# Patient Record
Sex: Female | Born: 1995 | Race: Black or African American | Hispanic: No | Marital: Single | Smoking: Former smoker
Health system: Southern US, Community
[De-identification: ages and names within clinical notes are randomized; demographics above are authoritative.]

## PROBLEM LIST (undated history)

## (undated) DIAGNOSIS — I10 Essential (primary) hypertension: Secondary | ICD-10-CM

## (undated) DIAGNOSIS — J189 Pneumonia, unspecified organism: Secondary | ICD-10-CM

## (undated) DIAGNOSIS — Z973 Presence of spectacles and contact lenses: Secondary | ICD-10-CM

## (undated) DIAGNOSIS — F32A Depression, unspecified: Secondary | ICD-10-CM

## (undated) DIAGNOSIS — J45909 Unspecified asthma, uncomplicated: Secondary | ICD-10-CM

## (undated) DIAGNOSIS — F419 Anxiety disorder, unspecified: Secondary | ICD-10-CM

## (undated) DIAGNOSIS — S82892A Other fracture of left lower leg, initial encounter for closed fracture: Secondary | ICD-10-CM

## (undated) DIAGNOSIS — F329 Major depressive disorder, single episode, unspecified: Secondary | ICD-10-CM

---
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
## 2018-12-17 ENCOUNTER — Other Ambulatory Visit: Payer: Self-pay

## 2018-12-17 ENCOUNTER — Encounter (HOSPITAL_COMMUNITY): Payer: Self-pay

## 2018-12-17 ENCOUNTER — Emergency Department (HOSPITAL_COMMUNITY)
Admission: EM | Admit: 2018-12-17 | Discharge: 2018-12-17 | Disposition: A | Discharge status: Home / Self Care | Payer: Self-pay | Attending: Emergency Medicine | Admitting: Emergency Medicine

## 2018-12-17 DIAGNOSIS — Y92410 Unspecified street and highway as the place of occurrence of the external cause: Secondary | ICD-10-CM | POA: Insufficient documentation

## 2018-12-17 DIAGNOSIS — J45909 Unspecified asthma, uncomplicated: Secondary | ICD-10-CM | POA: Insufficient documentation

## 2018-12-17 DIAGNOSIS — S0990XA Unspecified injury of head, initial encounter: Secondary | ICD-10-CM | POA: Insufficient documentation

## 2018-12-17 DIAGNOSIS — Y9389 Activity, other specified: Secondary | ICD-10-CM | POA: Insufficient documentation

## 2018-12-17 DIAGNOSIS — Y999 Unspecified external cause status: Secondary | ICD-10-CM | POA: Insufficient documentation

## 2018-12-17 HISTORY — DX: Unspecified asthma, uncomplicated: J45.909
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
## 2019-04-03 ENCOUNTER — Telehealth (HOSPITAL_COMMUNITY): Payer: Self-pay | Admitting: Emergency Medicine

## 2019-04-03 ENCOUNTER — Ambulatory Visit (HOSPITAL_COMMUNITY)
Admission: EM | Admit: 2019-04-03 | Discharge: 2019-04-03 | Disposition: A | Discharge status: Home / Self Care | Payer: BC Managed Care – PPO | Attending: Family Medicine | Admitting: Family Medicine

## 2019-04-03 ENCOUNTER — Other Ambulatory Visit: Payer: Self-pay

## 2019-04-03 ENCOUNTER — Encounter (HOSPITAL_COMMUNITY): Payer: Self-pay

## 2019-04-03 DIAGNOSIS — Z113 Encounter for screening for infections with a predominantly sexual mode of transmission: Secondary | ICD-10-CM | POA: Diagnosis not present

## 2019-04-03 DIAGNOSIS — N760 Acute vaginitis: Secondary | ICD-10-CM | POA: Diagnosis not present

## 2019-04-03 DIAGNOSIS — B373 Candidiasis of vulva and vagina: Secondary | ICD-10-CM

## 2019-04-03 DIAGNOSIS — Z3202 Encounter for pregnancy test, result negative: Secondary | ICD-10-CM | POA: Diagnosis not present

## 2019-04-03 DIAGNOSIS — B3731 Acute candidiasis of vulva and vagina: Secondary | ICD-10-CM

## 2019-04-03 LAB — POCT URINALYSIS DIP (DEVICE)
Bilirubin Urine: NEGATIVE
Glucose, UA: NEGATIVE mg/dL
Ketones, ur: NEGATIVE mg/dL
Leukocytes,Ua: NEGATIVE
Nitrite: NEGATIVE
Protein, ur: 30 mg/dL — AB
Specific Gravity, Urine: 1.025 (ref 1.005–1.030)
Urobilinogen, UA: 1 mg/dL (ref 0.0–1.0)
pH: 6 (ref 5.0–8.0)

## 2019-04-03 LAB — POCT PREGNANCY, URINE: Preg Test, Ur: NEGATIVE

## 2019-04-03 MED ORDER — FLUCONAZOLE 200 MG PO TABS: 200.0000 mg | ORAL_TABLET | Freq: Once | ORAL | 0 refills | Status: AC

## 2019-04-03 MED ORDER — FLUCONAZOLE 200 MG PO TABS: 200.0000 mg | ORAL_TABLET | Freq: Once | ORAL | 0 refills | Status: DC

## 2019-04-03 NOTE — ED Provider Notes (Signed)
[place]    CSN: [id number] Arrival date & time: 04/03/19  1738     History   Chief Complaint Chief Complaint  Patient presents with  . Vaginitis    HPI Lisa Espinoza is a 23 y.o. female presenting for acute concern of yeast infection.  Patient states that she noticed symptoms the other week, was trying to treat with over-the-counter Monistat and some "cooling wipes ".  Patient states that helped a little bit, though still having vaginal irritation, thick, white discharge and pruritus.  Patient states that after using the wipes 2 days ago she developed significant swelling, pain, itching, and scant blood.  Patient did not use wipes again, has been trying warm soaks, though is still having significant pain.  Patient denies abdominal, pelvic pain, frequency, urgency.  Patient does endorse a burning sensation when she urinates, "if it hits that area ".  Patient also requesting STD check: Currently sexually active 1 female partner, not routinely using condoms.   Past Medical History:  Diagnosis Date  . Asthma     There are no active problems to display for this patient.   History reviewed. No pertinent surgical history.  OB History   No obstetric history on file.      Home Medications    Prior to Admission medications   Not on File    Family History Family History  Family history unknown: Yes    Social History Social History   Tobacco Use  . Smoking status: Never Smoker  . Smokeless tobacco: Never Used  Substance Use Topics  . Alcohol use: Never    Frequency: Never  . Drug use: Never     Allergies   Patient has no known allergies.   Review of Systems As per HPI   Physical Exam Triage Vital Signs ED Triage Vitals  Enc Vitals Group     BP 04/03/19 1820 (!) 140/91     Pulse Rate 04/03/19 1820 87     Resp 04/03/19 1820 18     Temp 04/03/19 1820 99.5 F (37.5 C)     Temp Source 04/03/19 1820 Oral     SpO2 04/03/19 1820 98 %   Weight 04/03/19 1816 255 lb (115.7 kg)     Height --      Head Circumference --      Peak Flow --      Pain Score 04/03/19 1816 8     Pain Loc --      Pain Edu? --      Excl. in [place]? --    No data found.  Updated Vital Signs BP (!) 140/91 (BP Location: Right Arm)   Pulse 87   Temp 99.5 F (37.5 C) (Oral)   Resp 18   Wt 255 lb (115.7 kg)   LMP 03/15/2019   SpO2 98%   BMI 41.16 kg/m   Visual Acuity Right Eye Distance:   Left Eye Distance:   Bilateral Distance:    Right Eye Near:   Left Eye Near:    Bilateral Near:     Physical Exam Constitutional:      General: She is not in acute distress. HENT:     Head: Normocephalic and atraumatic.  Eyes:     General: No scleral icterus.    Pupils: Pupils are equal, round, and reactive to light.  Cardiovascular:     Rate and Rhythm: Normal rate.  Pulmonary:     Effort: Pulmonary effort is normal.  Genitourinary:  Comments: Minimal vulvovaginal edema without underlying erythema.  Posterior aspect of introitus with dry, erythematous skin and some superficial skin breakdown.  No active discharge, bleeding, malodor.  Patient is tender on bimanual exam at her introitus, no CMT tenderness or friability/erythema of cervix. Skin:    Coloration: Skin is not jaundiced or pale.  Neurological:     Mental Status: She is alert and oriented to person, place, and time.      UC Treatments / Results  Labs (all labs ordered are listed, but only abnormal results are displayed) Labs Reviewed  POCT URINALYSIS DIP (DEVICE) - Abnormal; Notable for the following components:      Result Value   Hgb urine dipstick MODERATE (*)    Protein, ur 30 (*)    All other components within normal limits  POC URINE PREG, ED  POCT PREGNANCY, URINE  CERVICOVAGINAL ANCILLARY ONLY    EKG None  Radiology No results found.  Procedures Procedures (including critical care time)  Medications Ordered in UC Medications - No data to display  Initial  Impression / Assessment and Plan / UC Course  I have reviewed the triage vital signs and the nursing notes.  Pertinent labs & imaging results that were available during my care of the patient were reviewed by me and considered in my medical decision making (see chart for details).     23 year old female presenting for acute concern of vaginal irritation.  History consistent with yeast infection, will start Diflucan as OTC Monistat did not resolve symptoms.  Patient has significant dermatitis of her introitus with skin breakdown.  No acute concern for infection, gangrene.  Reviewed case with Dr. Milus GlazierLauenstein, who agreed with treatment.  Will treat yeast infection, use Domeboro or or Burow solution in lukewarm bath for additional pain relief in the interim.  STD screening pending, will contact patient if positive and treat appropriately.  Return precautions discussed, patient verbalized understanding. Final Clinical Impressions(s) / UC Diagnoses   Final diagnoses:  Vaginal yeast infection  Vaginitis and vulvovaginitis  Screening for venereal disease     Discharge Instructions     May use Domeboro or Burow's Solution (ask pharmacist) in luke warm bath for additional relief    ED Prescriptions    Medication Sig Dispense Auth. Provider   fluconazole (DIFLUCAN) 200 MG tablet Take 1 tablet (200 mg total) by mouth once for 1 dose. May repeat in 72 hours if needed 2 tablet Hall-Potvin, [place], PA-C     Controlled Substance Prescriptions [place] Controlled Substance Registry consulted? Not Applicable   Shea EvansHall-Potvin, , [place]PA-C 04/04/19 1637

## 2019-04-03 NOTE — Discharge Instructions (Addendum)
May use Domeboro or Burow's Solution (ask pharmacist) in luke warm bath for additional relief

## 2019-04-03 NOTE — ED Triage Notes (Signed)
Pt states she has a yeast infection and she has a reaction to some yeast infection wipe she purchased.

## 2019-04-04 ENCOUNTER — Encounter (HOSPITAL_COMMUNITY): Payer: Self-pay | Admitting: Emergency Medicine

## 2019-04-04 LAB — CERVICOVAGINAL ANCILLARY ONLY
Bacterial vaginitis: NEGATIVE
Candida vaginitis: NEGATIVE
Chlamydia: NEGATIVE
Neisseria Gonorrhea: NEGATIVE
Trichomonas: NEGATIVE

## 2019-11-12 ENCOUNTER — Emergency Department (HOSPITAL_COMMUNITY)
Admission: EM | Admit: 2019-11-12 | Discharge: 2019-11-12 | Disposition: A | Discharge status: Home / Self Care | Payer: BC Managed Care – PPO | Attending: Emergency Medicine | Admitting: Emergency Medicine

## 2019-11-12 ENCOUNTER — Emergency Department (HOSPITAL_COMMUNITY): Payer: BC Managed Care – PPO

## 2019-11-12 DIAGNOSIS — W231XXA Caught, crushed, jammed, or pinched between stationary objects, initial encounter: Secondary | ICD-10-CM | POA: Insufficient documentation

## 2019-11-12 DIAGNOSIS — Y999 Unspecified external cause status: Secondary | ICD-10-CM | POA: Diagnosis not present

## 2019-11-12 DIAGNOSIS — S99912A Unspecified injury of left ankle, initial encounter: Secondary | ICD-10-CM | POA: Diagnosis present

## 2019-11-12 DIAGNOSIS — W19XXXA Unspecified fall, initial encounter: Secondary | ICD-10-CM

## 2019-11-12 DIAGNOSIS — X500XXA Overexertion from strenuous movement or load, initial encounter: Secondary | ICD-10-CM | POA: Insufficient documentation

## 2019-11-12 DIAGNOSIS — Q7292 Unspecified reduction defect of left lower limb: Secondary | ICD-10-CM

## 2019-11-12 DIAGNOSIS — S9305XA Dislocation of left ankle joint, initial encounter: Secondary | ICD-10-CM | POA: Diagnosis not present

## 2019-11-12 DIAGNOSIS — S82832A Other fracture of upper and lower end of left fibula, initial encounter for closed fracture: Secondary | ICD-10-CM | POA: Diagnosis not present

## 2019-11-12 DIAGNOSIS — S8262XA Displaced fracture of lateral malleolus of left fibula, initial encounter for closed fracture: Secondary | ICD-10-CM | POA: Diagnosis not present

## 2019-11-12 DIAGNOSIS — Y929 Unspecified place or not applicable: Secondary | ICD-10-CM | POA: Insufficient documentation

## 2019-11-12 DIAGNOSIS — Y939 Activity, unspecified: Secondary | ICD-10-CM | POA: Insufficient documentation

## 2019-11-12 MED ORDER — OXYCODONE-ACETAMINOPHEN 5-325 MG PO TABS: 1.0000 | ORAL_TABLET | ORAL | 0 refills | Status: DC | PRN

## 2019-11-12 MED ORDER — HYDROMORPHONE HCL 1 MG/ML IJ SOLN
1.0000 mg | Freq: Once | INTRAMUSCULAR | Status: AC
  Administered 2019-11-12: 1 mg via INTRAVENOUS
  Filled 2019-11-12: qty 1

## 2019-11-12 MED ORDER — IBUPROFEN 800 MG PO TABS: 800.0000 mg | ORAL_TABLET | Freq: Three times a day (TID) | ORAL | 0 refills | Status: DC

## 2019-11-12 MED ORDER — ETOMIDATE 2 MG/ML IV SOLN
10.0000 mg | Freq: Once | INTRAVENOUS | Status: AC
  Administered 2019-11-12: 18:00:00 10 mg via INTRAVENOUS
  Filled 2019-11-12: qty 10

## 2019-11-12 MED ORDER — MIDAZOLAM HCL 2 MG/2ML IJ SOLN
1.0000 mg | Freq: Once | INTRAMUSCULAR | Status: AC
  Administered 2019-11-12: 1 mg via INTRAVENOUS
  Filled 2019-11-12: qty 2

## 2019-11-12 NOTE — ED Notes (Signed)
Patient placed in gown and clothes removed and placed in belongings bag.   Patient placed on 12 lead.  Suction set up at bedside.  AMIDATE and VERSED at bedside.  IV patent and flushed.

## 2019-11-12 NOTE — ED Notes (Signed)
X-ray at bedside

## 2019-11-12 NOTE — Discharge Instructions (Addendum)
Keep ankle elevated and intermittently ice.

## 2019-11-12 NOTE — ED Triage Notes (Addendum)
Patients foot got caught on end of couch. Patient felt her left ankle twist.   Denies other injuries or hitting head.   C/O left ankle pain  7/10 pain   20g right AC 300 mcg fentanyl  BP-170/110 HR-116 98% RA CBG-114 98.2 temp   No medical hx.  No medication No allergies    LMP- Last week

## 2019-11-12 NOTE — ED Provider Notes (Addendum)
[facility]-EMERGENCY DEPT Provider Note   CSN: [id number] Arrival date & time: 11/12/19  1655     History Chief Complaint  Patient presents with  . Ankle Injury    Lisa Espinoza is a 24 y.o. female.  Pt presents to the ED today with left ankle pain.  The pt was moving furniture and her foot got caught under the couch and she fell.  She felt an instant pain and saw a deformity and could not walk.  She called EMS who gave her 300 mcg of fentanyl en route.  Pt is still in 7/10 pain.        Past Medical History:  Diagnosis Date  . Asthma     There are no problems to display for this patient.   History reviewed. No pertinent surgical history.   OB History   No obstetric history on file.     Family History  Family history unknown: Yes    Social History   Tobacco Use  . Smoking status: Never Smoker  . Smokeless tobacco: Never Used  Substance Use Topics  . Alcohol use: Never  . Drug use: Never    Home Medications Prior to Admission medications   Medication Sig Start Date End Date Taking? Authorizing Provider  acetaminophen (TYLENOL) 325 MG tablet Take 650 mg by mouth every 6 (six) hours as needed for mild pain or headache.   Yes [provider]  cetirizine (ZYRTEC) 10 MG tablet Take 10 mg by mouth daily.   Yes [provider]  JUNEL 1.5/30 1.5-30 MG-MCG tablet Take 1 tablet by mouth daily. 10/14/19  Yes [provider]  ibuprofen (ADVIL) 800 MG tablet Take 1 tablet (800 mg total) by mouth 3 (three) times daily. 11/12/19   Jacalyn Lefevre, MD  oxyCODONE-acetaminophen (PERCOCET/ROXICET) 5-325 MG tablet Take 1 tablet by mouth every 4 (four) hours as needed for severe pain. 11/12/19   Jacalyn Lefevre, MD    Allergies    Patient has no known allergies.  Review of Systems   Review of Systems  Musculoskeletal:       Left ankle pain  All other systems reviewed and are negative.   Physical Exam Updated Vital Signs BP  (!) 146/131   Pulse (!) 113   Temp 98.6 F (37 C) (Oral)   Resp (!) 41   LMP 10/29/2019   SpO2 100%   Physical Exam Vitals and nursing note reviewed.  Constitutional:      Appearance: Normal appearance.  HENT:     Head: Normocephalic and atraumatic.     Right Ear: External ear normal.     Left Ear: External ear normal.     Nose: Nose normal.     Mouth/Throat:     Mouth: Mucous membranes are moist.     Pharynx: Oropharynx is clear.  Eyes:     Extraocular Movements: Extraocular movements intact.     Conjunctiva/sclera: Conjunctivae normal.     Pupils: Pupils are equal, round, and reactive to light.  Cardiovascular:     Rate and Rhythm: Normal rate and regular rhythm.     Pulses: Normal pulses.     Heart sounds: Normal heart sounds.  Pulmonary:     Effort: Pulmonary effort is normal.     Breath sounds: Normal breath sounds.  Abdominal:     General: Abdomen is flat. Bowel sounds are normal.     Palpations: Abdomen is soft.  Musculoskeletal:     Cervical back: Normal range of  motion and neck supple.     Left ankle: Swelling and deformity present. Decreased range of motion.       Legs:  Skin:    General: Skin is warm.     Capillary Refill: Capillary refill takes less than 2 seconds.  Neurological:     General: No focal deficit present.     Mental Status: She is alert and oriented to person, place, and time.  Psychiatric:        Mood and Affect: Mood normal.        Behavior: Behavior normal.     ED Results / Procedures / Treatments   Labs (all labs ordered are listed, but only abnormal results are displayed) Labs Reviewed  SARS CORONAVIRUS 2 (TAT 6-24 HRS)    EKG None  Radiology DG Ankle 2 Views Left  Result Date: 11/12/2019 CLINICAL DATA:  Status post reduction of the left ankle. EXAM: LEFT ANKLE - 2 VIEW COMPARISON:  November 12, 2019 FINDINGS: Acute fracture deformities are seen involving the left medial malleolus and distal shaft of the left fibula. Gross  anatomic alignment is seen. There is been interval reduction of the previously noted left ankle dislocation. Mild diffuse soft tissue swelling is seen. IMPRESSION: 1. Acute fractures of the left medial malleolus and distal left fibula with interval reduction of the previously noted left ankle dislocation seen on the prior study dated November 12, 2019 (acquired at 5:34 p.m. Electronically Signed   By: Virgina Norfolk M.D.   On: 11/12/2019 18:54   DG Ankle Complete Left  Result Date: 11/12/2019 CLINICAL DATA:  Status post trauma. EXAM: LEFT ANKLE COMPLETE - 3+ VIEW COMPARISON:  None. FINDINGS: A displaced fracture deformity is seen involving the left medial malleolus. An additional displaced fracture of the distal shaft of the left fibula is noted with anteromedial angulation of the fracture apex. There is disruption of the left ankle mortise with posterolateral dislocation of the left ankle. Moderate severity diffuse soft tissue swelling is noted. IMPRESSION: 1. Displaced fractures of the left medial malleolus and distal shaft of the left fibula. 2. Disruption of the left ankle mortise with posterolateral dislocation of the left ankle. Electronically Signed   By: Virgina Norfolk M.D.   On: 11/12/2019 18:34    Procedures .Sedation  Date/Time: 11/12/2019 6:51 PM Performed by: Isla Pence, MD Authorized by: Isla Pence, MD   Consent:    Consent obtained:  Written   Consent given by:  Patient   Alternatives discussed:  Analgesia without sedation Universal protocol:    Immediately prior to procedure a time out was called: yes   Indications:    Procedure performed:  Fracture reduction   Procedure necessitating sedation performed by:  Physician performing sedation Pre-sedation assessment:    Time since last food or drink:  4   ASA classification: class 2 - patient with mild systemic disease     Neck mobility: normal     Mallampati score:  II - soft palate, uvula, fauces visible    Pre-sedation assessments completed and reviewed: airway patency, cardiovascular function, hydration status, mental status, nausea/vomiting and pain level   Immediate pre-procedure details:    Reassessment: Patient reassessed immediately prior to procedure     Reviewed: vital signs     Verified: bag valve mask available, emergency equipment available, intubation equipment available and IV patency confirmed   Procedure details (see MAR for exact dosages):    Preoxygenation:  Room air   Sedation:  Etomidate and midazolam  Intended level of sedation: deep   Analgesia:  Hydromorphone   Intra-procedure monitoring:  Blood pressure monitoring, cardiac monitor, continuous capnometry and continuous pulse oximetry   Intra-procedure events: none     Total Provider sedation time (minutes):  30 Post-procedure details:    Post-sedation assessment completed:  11/12/2019 6:54 PM   Attendance: Constant attendance by certified staff until patient recovered     Patient is stable for discharge or admission: yes     Patient tolerance:  Tolerated well, no immediate complications .Splint Application  Date/Time: 11/12/2019 6:55 PM Performed by: Jacalyn Lefevre, MD Authorized by: Jacalyn Lefevre, MD   Consent:    Consent obtained:  Written   Consent given by:  Patient   Alternatives discussed:  No treatment Pre-procedure details:    Sensation:  Normal Procedure details:    Laterality:  Left   Location:  Ankle   Ankle:  L ankle   Splint type:  Short leg   Supplies:  Cotton padding, Ortho-Glass and elastic bandage Post-procedure details:    Pain:  Improved   Sensation:  Normal   Patient tolerance of procedure:  Tolerated well, no immediate complications .Splint Application  Date/Time: 11/12/2019 6:56 PM Performed by: Jacalyn Lefevre, MD Authorized by: Jacalyn Lefevre, MD   Consent:    Consent obtained:  Written   Consent given by:  Patient   Alternatives discussed:  No treatment Pre-procedure  details:    Sensation:  Normal Procedure details:    Laterality:  Left   Location:  Ankle   Ankle:  L ankle   Splint type:  Sugar tong   Supplies:  Cotton padding, elastic bandage and Ortho-Glass Post-procedure details:    Pain:  Improved   Patient tolerance of procedure:  Tolerated well, no immediate complications Reduction of fracture  Date/Time: 11/12/2019 6:56 PM Performed by: Jacalyn Lefevre, MD Authorized by: Jacalyn Lefevre, MD  Consent: Written consent obtained. Risks and benefits: risks, benefits and alternatives were discussed Consent given by: patient Patient understanding: patient states understanding of the procedure being performed Patient identity confirmed: verbally with patient Time out: Immediately prior to procedure a "time out" was called to verify the correct patient, procedure, equipment, support staff and site/side marked as required. Preparation: Patient was prepped and draped in the usual sterile fashion. Local anesthesia used: no  Anesthesia: Local anesthesia used: no  Sedation: Patient sedated: yes Sedation type: moderate (conscious) sedation Sedatives: etomidate and midazolam Analgesia: hydromorphone  Patient tolerance: patient tolerated the procedure well with no immediate complications Comments: Pt's fracture reduced.    (including critical care time)  Medications Ordered in ED Medications  HYDROmorphone (DILAUDID) injection 1 mg (has no administration in time range)  HYDROmorphone (DILAUDID) injection 1 mg (1 mg Intravenous Given 11/12/19 1715)  etomidate (AMIDATE) injection 10 mg (10 mg Intravenous Given 11/12/19 1803)  midazolam (VERSED) injection 1 mg (1 mg Intravenous Given 11/12/19 1801)    ED Course  I have reviewed the triage vital signs and the nursing notes.  Pertinent labs & imaging results that were available during my care of the patient were reviewed by me and considered in my medical decision making (see chart for details).      MDM Rules/Calculators/A&P                      Pt d/w Dr. Magnus Ivan (ortho) who said for pt to call the office tomorrow to be seen tomorrow.  She is to stay non weight bearing to the left  leg.  Covid swab will be done prior to d/c.  Pt given crutches.   Final Clinical Impression(s) / ED Diagnoses Final diagnoses:  Closed fracture of distal lateral malleolus of left fibula, initial encounter  Ankle dislocation, left, initial encounter  Closed fracture of distal end of left fibula, unspecified fracture morphology, initial encounter    Rx / DC Orders ED Discharge Orders         Ordered    oxyCODONE-acetaminophen (PERCOCET/ROXICET) 5-325 MG tablet  Every 4 hours PRN     11/12/19 1903    ibuprofen (ADVIL) 800 MG tablet  3 times daily     11/12/19 1903           Jacalyn Lefevre, MD 11/12/19 Avanell Shackleton    Jacalyn Lefevre, MD 11/28/19 1705

## 2019-11-13 ENCOUNTER — Encounter: Payer: Self-pay | Admitting: Orthopaedic Surgery

## 2019-11-13 ENCOUNTER — Ambulatory Visit (INDEPENDENT_AMBULATORY_CARE_PROVIDER_SITE_OTHER): Payer: BC Managed Care – PPO | Admitting: Orthopaedic Surgery

## 2019-11-13 ENCOUNTER — Other Ambulatory Visit: Payer: Self-pay

## 2019-11-13 DIAGNOSIS — S82842A Displaced bimalleolar fracture of left lower leg, initial encounter for closed fracture: Secondary | ICD-10-CM

## 2019-11-13 MED ORDER — OXYCODONE HCL 5 MG PO TABS: 5.0000 mg | ORAL_TABLET | ORAL | 0 refills | Status: DC | PRN

## 2019-11-13 NOTE — Progress Notes (Signed)
Office Visit Note   Patient: Lisa Espinoza           Date of Birth: 1996/05/11           MRN: [id number] Visit Date: 11/13/2019              Requested by: No referring provider defined for this encounter. PCP: Patient, No Pcp Per   Assessment & Plan: Visit Diagnoses:  1. Bimalleolar ankle fracture, left, closed, initial encounter     Plan: I shared with the patient her x-rays and showed a ankle model and described in detail the recommendation for surgery.  We talked about what the intraoperative and postoperative course would be.  I described how we did fix the fracture as well.  She will be nonweightbearing for about 4 to 6 weeks.  For the next 2 days and need her to stay off her ankle is much as possible and to elevate it above the heart level to get the soft tissue swelling to subside so we can safely perform surgery early next week.  We will work on getting this scheduled.  I sent in some more oxycodone for her.  All question concerns were answered and addressed.  We would then see her back in 2 weeks for splint removal and repeat 3 views of her left ankle.  Follow-Up Instructions: Return for 2 weeks post-op.   Orders:  No orders of the defined types were placed in this encounter.  Meds ordered this encounter  Medications  . oxyCODONE (ROXICODONE) 5 MG immediate release tablet    Sig: Take 1-2 tablets (5-10 mg total) by mouth every 4 (four) hours as needed for severe pain.    Dispense:  30 tablet    Refill:  0      Procedures: No procedures performed   Clinical Data: No additional findings.   Subjective: Chief Complaint  Patient presents with  . Left Ankle - Pain, Injury  Patient comes in today as referral from emergency room last evening from a injury to her left ankle.  She was moving furniture when she had a mechanical fall and sustained a closed dislocation of her left ankle.  This was reduced by the ER staff.  She was placed in a splint and given follow-up in  our office today.  She does weigh about 255 pounds.  She is a very pleasant individual and not a smoker or diabetic.  She does report significant left ankle pain.  I was able to review the x-rays last night.  She was placed appropriate in a splint and they were able to get up pretty good reduction of her ankle in terms of taking pressure off the soft tissues.  HPI  Review of Systems She currently denies any headache, chest pain, shortness of breath, fever, chills, nausea, vomiting  Objective: Vital Signs: LMP 10/29/2019   Physical Exam She is alert and orient x3 and in no acute distress Ortho Exam Examination of her left ankle shows appropriately in a splint.  I did not take down the splint.  It looks anatomically in good position and her toes are well perfused in the splint does not appear to be causing any soft tissue issues. Specialty Comments:  No specialty comments available.  Imaging: DG Ankle 2 Views Left  Result Date: 11/12/2019 CLINICAL DATA:  Status post reduction of the left ankle. EXAM: LEFT ANKLE - 2 VIEW COMPARISON:  November 12, 2019 FINDINGS: Acute fracture deformities are seen involving the left medial  malleolus and distal shaft of the left fibula. Gross anatomic alignment is seen. There is been interval reduction of the previously noted left ankle dislocation. Mild diffuse soft tissue swelling is seen. IMPRESSION: 1. Acute fractures of the left medial malleolus and distal left fibula with interval reduction of the previously noted left ankle dislocation seen on the prior study dated November 12, 2019 (acquired at 5:34 p.m. Electronically Signed   By: Aram Candela M.D.   On: 11/12/2019 18:54   DG Ankle Complete Left  Result Date: 11/12/2019 CLINICAL DATA:  Status post trauma. EXAM: LEFT ANKLE COMPLETE - 3+ VIEW COMPARISON:  None. FINDINGS: A displaced fracture deformity is seen involving the left medial malleolus. An additional displaced fracture of the distal shaft of the  left fibula is noted with anteromedial angulation of the fracture apex. There is disruption of the left ankle mortise with posterolateral dislocation of the left ankle. Moderate severity diffuse soft tissue swelling is noted. IMPRESSION: 1. Displaced fractures of the left medial malleolus and distal shaft of the left fibula. 2. Disruption of the left ankle mortise with posterolateral dislocation of the left ankle. Electronically Signed   By: Aram Candela M.D.   On: 11/12/2019 18:34   Independent review of the x-rays do confirm a significant ankle fracture involving the fibula/lateral malleolus and the medial malleolus.  I am concerned there may be a syndesmosis injury as well given the nature of the dislocation and the fibula fracture being broken proximal to the ankle mortise.  PMFS History: There are no problems to display for this patient.  Past Medical History:  Diagnosis Date  . Asthma     Family History  Family history unknown: Yes    History reviewed. No pertinent surgical history. Social History   Occupational History  . Not on file  Tobacco Use  . Smoking status: Never Smoker  . Smokeless tobacco: Never Used  Substance and Sexual Activity  . Alcohol use: Never  . Drug use: Never  . Sexual activity: Not on file

## 2019-11-14 ENCOUNTER — Other Ambulatory Visit: Payer: Self-pay | Admitting: Orthopaedic Surgery

## 2019-11-14 ENCOUNTER — Other Ambulatory Visit: Payer: Self-pay | Admitting: Physician Assistant

## 2019-11-14 ENCOUNTER — Telehealth: Payer: Self-pay | Admitting: Orthopaedic Surgery

## 2019-11-14 ENCOUNTER — Telehealth: Payer: Self-pay

## 2019-11-14 MED ORDER — OXYCODONE HCL 5 MG PO TABS: 5.0000 mg | ORAL_TABLET | Freq: Four times a day (QID) | ORAL | 0 refills | Status: DC | PRN

## 2019-11-14 NOTE — Telephone Encounter (Signed)
[facility] pharmacy called stating that Rx for Oxycodone 5mg  needs to changed to 1 every 6hrs or 2 every 6hrs.  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 PRN.  CB# [phone].  Please advise.  Thank you.

## 2019-11-14 NOTE — Telephone Encounter (Signed)
It would actually not be okay to fly right after surgery on the ankle.  With high altitudes and barometric pressure changes she would get significantly worse swelling and be at a higher blood clot risk as well.

## 2019-11-14 NOTE — Telephone Encounter (Signed)
Called patient to advise. She is aware.  

## 2019-11-14 NOTE — Telephone Encounter (Signed)
Thank you :)

## 2019-11-14 NOTE — Telephone Encounter (Signed)
I will re-submit some for every 6 hours instead.

## 2019-11-14 NOTE — Telephone Encounter (Signed)
Pt called in said she is scheduled to have surgery with Dr.Blackman 11-19-19 for left ankle fracture and is wanting to ask him if it would be okay for her to fly out of town right after the surgery or would she need to wait until her first post-op appt which is scheduled for 12-03-19?  Please give pt a call to advise  [phone]

## 2019-11-15 ENCOUNTER — Other Ambulatory Visit: Payer: Self-pay

## 2019-11-15 ENCOUNTER — Encounter (HOSPITAL_COMMUNITY): Payer: Self-pay | Admitting: Orthopaedic Surgery

## 2019-11-15 NOTE — Progress Notes (Signed)
Pt denies SOB, chest pain, and being under the care of a cardiologist. Pt denies having a PCP. Pt denies having a stress test, echo and cardiac cath. Pt denies having an EKG and chest x ray in the last year. Pt denies recent labs. Pt made aware to stop taking  Aspirin (unless otherwise advised by surgeon), vitamins, fish oil and herbal medications. Do not take any NSAIDs ie: Ibuprofen, Advil, Naproxen (Aleve), Motrin, BC and Goody Powder. Pt reminded to quarantine. Pt verbalized understanding of all pre-op instructions.

## 2019-11-18 ENCOUNTER — Other Ambulatory Visit (HOSPITAL_COMMUNITY)
Admission: RE | Admit: 2019-11-18 | Discharge: 2019-11-18 | Disposition: A | Discharge status: Home / Self Care | Payer: BC Managed Care – PPO | Source: Ambulatory Visit | Attending: Orthopaedic Surgery | Admitting: Orthopaedic Surgery

## 2019-11-18 DIAGNOSIS — Z20822 Contact with and (suspected) exposure to covid-19: Secondary | ICD-10-CM | POA: Diagnosis not present

## 2019-11-18 DIAGNOSIS — Z01812 Encounter for preprocedural laboratory examination: Secondary | ICD-10-CM | POA: Insufficient documentation

## 2019-11-18 LAB — SARS CORONAVIRUS 2 (TAT 6-24 HRS): SARS Coronavirus 2: NEGATIVE

## 2019-11-19 ENCOUNTER — Ambulatory Visit (HOSPITAL_COMMUNITY): Payer: BC Managed Care – PPO

## 2019-11-19 ENCOUNTER — Encounter (HOSPITAL_COMMUNITY)
Admission: RE | Disposition: A | Discharge status: Home Health | Payer: Self-pay | Source: Home / Self Care | Attending: Orthopaedic Surgery

## 2019-11-19 ENCOUNTER — Other Ambulatory Visit: Payer: Self-pay

## 2019-11-19 ENCOUNTER — Ambulatory Visit (HOSPITAL_COMMUNITY): Payer: BC Managed Care – PPO | Admitting: Anesthesiology

## 2019-11-19 ENCOUNTER — Inpatient Hospital Stay (HOSPITAL_COMMUNITY)
Admission: RE | Admit: 2019-11-19 | Discharge: 2019-11-21 | DRG: 493 | Disposition: A | Discharge status: Home Health | Payer: BC Managed Care – PPO | Attending: Orthopaedic Surgery | Admitting: Orthopaedic Surgery

## 2019-11-19 ENCOUNTER — Encounter (HOSPITAL_COMMUNITY): Payer: Self-pay | Admitting: Orthopaedic Surgery

## 2019-11-19 DIAGNOSIS — T1490XA Injury, unspecified, initial encounter: Secondary | ICD-10-CM

## 2019-11-19 DIAGNOSIS — Z6841 Body Mass Index (BMI) 40.0 and over, adult: Secondary | ICD-10-CM

## 2019-11-19 DIAGNOSIS — F419 Anxiety disorder, unspecified: Secondary | ICD-10-CM | POA: Diagnosis present

## 2019-11-19 DIAGNOSIS — S82842A Displaced bimalleolar fracture of left lower leg, initial encounter for closed fracture: Secondary | ICD-10-CM

## 2019-11-19 DIAGNOSIS — G8918 Other acute postprocedural pain: Secondary | ICD-10-CM | POA: Diagnosis not present

## 2019-11-19 DIAGNOSIS — W1830XA Fall on same level, unspecified, initial encounter: Secondary | ICD-10-CM | POA: Diagnosis present

## 2019-11-19 DIAGNOSIS — Z20822 Contact with and (suspected) exposure to covid-19: Secondary | ICD-10-CM | POA: Diagnosis present

## 2019-11-19 DIAGNOSIS — I10 Essential (primary) hypertension: Secondary | ICD-10-CM | POA: Diagnosis present

## 2019-11-19 DIAGNOSIS — J45909 Unspecified asthma, uncomplicated: Secondary | ICD-10-CM | POA: Diagnosis present

## 2019-11-19 DIAGNOSIS — S9305XA Dislocation of left ankle joint, initial encounter: Secondary | ICD-10-CM | POA: Diagnosis present

## 2019-11-19 DIAGNOSIS — F17210 Nicotine dependence, cigarettes, uncomplicated: Secondary | ICD-10-CM | POA: Diagnosis present

## 2019-11-19 DIAGNOSIS — F329 Major depressive disorder, single episode, unspecified: Secondary | ICD-10-CM | POA: Diagnosis present

## 2019-11-19 DIAGNOSIS — S93432A Sprain of tibiofibular ligament of left ankle, initial encounter: Secondary | ICD-10-CM | POA: Diagnosis present

## 2019-11-19 HISTORY — DX: Other fracture of left lower leg, initial encounter for closed fracture: S82.892A

## 2019-11-19 HISTORY — PX: ORIF ANKLE FRACTURE: SHX5408

## 2019-11-19 HISTORY — DX: Essential (primary) hypertension: I10

## 2019-11-19 HISTORY — DX: Pneumonia, unspecified organism: J18.9

## 2019-11-19 HISTORY — DX: Presence of spectacles and contact lenses: Z97.3

## 2019-11-19 HISTORY — DX: Anxiety disorder, unspecified: F41.9

## 2019-11-19 HISTORY — DX: Depression, unspecified: F32.A

## 2019-11-19 LAB — BASIC METABOLIC PANEL
Anion gap: 15 (ref 5–15)
BUN: 13 mg/dL (ref 6–20)
CO2: 17 mmol/L — ABNORMAL LOW (ref 22–32)
Calcium: 9.1 mg/dL (ref 8.9–10.3)
Chloride: 107 mmol/L (ref 98–111)
Creatinine, Ser: 1.01 mg/dL — ABNORMAL HIGH (ref 0.44–1.00)
GFR calc Af Amer: >60 mL/min (ref >60)
GFR calc non Af Amer: >60 mL/min (ref >60)
Glucose, Bld: 82 mg/dL (ref 70–99)
Potassium: 4.2 mmol/L (ref 3.5–5.1)
Sodium: 139 mmol/L (ref 135–145)

## 2019-11-19 LAB — CBC
HCT: 42.9 % (ref 36.0–46.0)
Hemoglobin: 14.6 g/dL (ref 12.0–15.0)
MCH: 33.1 pg (ref 26.0–34.0)
MCHC: 34 g/dL (ref 30.0–36.0)
MCV: 97.3 fL (ref 80.0–100.0)
Platelets: 380 10*3/uL (ref 150–400)
RBC: 4.41 MIL/uL (ref 3.87–5.11)
RDW: 12.6 % (ref 11.5–15.5)
WBC: 11.6 10*3/uL — ABNORMAL HIGH (ref 4.0–10.5)
nRBC: 0 % (ref 0.0–0.2)

## 2019-11-19 LAB — POCT PREGNANCY, URINE: Preg Test, Ur: NEGATIVE

## 2019-11-19 SURGERY — OPEN REDUCTION INTERNAL FIXATION (ORIF) ANKLE FRACTURE
Anesthesia: General | Site: Ankle | Laterality: Left

## 2019-11-19 MED ORDER — ONDANSETRON HCL 4 MG/2ML IJ SOLN
INTRAMUSCULAR | Status: DC | PRN
  Administered 2019-11-19: 4 mg via INTRAVENOUS

## 2019-11-19 MED ORDER — 0.9 % SODIUM CHLORIDE (POUR BTL) OPTIME
TOPICAL | Status: DC | PRN
  Administered 2019-11-19: 1000 mL

## 2019-11-19 MED ORDER — ACETAMINOPHEN 160 MG/5ML PO SOLN: 325.0000 mg | ORAL | Status: DC | PRN

## 2019-11-19 MED ORDER — OXYCODONE HCL 5 MG PO TABS: 5.0000 mg | ORAL_TABLET | Freq: Once | ORAL | Status: DC | PRN

## 2019-11-19 MED ORDER — METOCLOPRAMIDE HCL 5 MG/ML IJ SOLN: 5.0000 mg | Freq: Three times a day (TID) | INTRAMUSCULAR | Status: DC | PRN

## 2019-11-19 MED ORDER — LIDOCAINE 2% (20 MG/ML) 5 ML SYRINGE
INTRAMUSCULAR | Status: AC
  Filled 2019-11-19: qty 5

## 2019-11-19 MED ORDER — ACETAMINOPHEN 325 MG PO TABS: 325.0000 mg | ORAL_TABLET | ORAL | Status: DC | PRN

## 2019-11-19 MED ORDER — MIDAZOLAM HCL 2 MG/2ML IJ SOLN
INTRAMUSCULAR | Status: AC
  Administered 2019-11-19: 2 mg via INTRAVENOUS
  Filled 2019-11-19: qty 2

## 2019-11-19 MED ORDER — DIPHENHYDRAMINE HCL 12.5 MG/5ML PO ELIX: 12.5000 mg | ORAL_SOLUTION | ORAL | Status: DC | PRN

## 2019-11-19 MED ORDER — BUPIVACAINE-EPINEPHRINE (PF) 0.25% -1:200000 IJ SOLN
INTRAMUSCULAR | Status: DC | PRN
  Administered 2019-11-19: 30 mL

## 2019-11-19 MED ORDER — METOCLOPRAMIDE HCL 5 MG PO TABS: 5.0000 mg | ORAL_TABLET | Freq: Three times a day (TID) | ORAL | Status: DC | PRN

## 2019-11-19 MED ORDER — ONDANSETRON HCL 4 MG/2ML IJ SOLN: 4.0000 mg | Freq: Once | INTRAMUSCULAR | Status: DC | PRN

## 2019-11-19 MED ORDER — PROPOFOL 10 MG/ML IV BOLUS
INTRAVENOUS | Status: AC
  Filled 2019-11-19: qty 20

## 2019-11-19 MED ORDER — OXYCODONE HCL 5 MG PO TABS
10.0000 mg | ORAL_TABLET | ORAL | Status: DC | PRN
  Administered 2019-11-20: 15 mg via ORAL
  Administered 2019-11-21: 10 mg via ORAL
  Filled 2019-11-19 (×2): qty 3

## 2019-11-19 MED ORDER — FENTANYL CITRATE (PF) 100 MCG/2ML IJ SOLN
25.0000 ug | INTRAMUSCULAR | Status: DC | PRN
  Administered 2019-11-19 (×2): 50 ug via INTRAVENOUS

## 2019-11-19 MED ORDER — POLYETHYLENE GLYCOL 3350 17 G PO PACK: 17.0000 g | PACK | Freq: Every day | ORAL | Status: DC | PRN

## 2019-11-19 MED ORDER — EPHEDRINE SULFATE-NACL 50-0.9 MG/10ML-% IV SOSY
PREFILLED_SYRINGE | INTRAVENOUS | Status: DC | PRN
  Administered 2019-11-19: 10 mg via INTRAVENOUS
  Administered 2019-11-19 (×2): 5 mg via INTRAVENOUS

## 2019-11-19 MED ORDER — ROCURONIUM BROMIDE 10 MG/ML (PF) SYRINGE
PREFILLED_SYRINGE | INTRAVENOUS | Status: AC
  Filled 2019-11-19: qty 10

## 2019-11-19 MED ORDER — CEFAZOLIN SODIUM-DEXTROSE 2-4 GM/100ML-% IV SOLN
2.0000 g | INTRAVENOUS | Status: AC
  Administered 2019-11-19: 14:00:00 2 g via INTRAVENOUS

## 2019-11-19 MED ORDER — METHOCARBAMOL 500 MG PO TABS
500.0000 mg | ORAL_TABLET | Freq: Four times a day (QID) | ORAL | Status: DC | PRN
  Administered 2019-11-19 – 2019-11-21 (×5): 500 mg via ORAL
  Filled 2019-11-19 (×5): qty 1

## 2019-11-19 MED ORDER — METHOCARBAMOL 1000 MG/10ML IJ SOLN: 500.0000 mg | Freq: Four times a day (QID) | INTRAVENOUS | Status: DC | PRN

## 2019-11-19 MED ORDER — HYDROMORPHONE HCL 1 MG/ML IJ SOLN
0.5000 mg | INTRAMUSCULAR | Status: DC | PRN
  Administered 2019-11-19 – 2019-11-20 (×5): 1 mg via INTRAVENOUS
  Filled 2019-11-19 (×5): qty 1

## 2019-11-19 MED ORDER — CEFAZOLIN SODIUM-DEXTROSE 1-4 GM/50ML-% IV SOLN
1.0000 g | Freq: Four times a day (QID) | INTRAVENOUS | Status: AC
  Administered 2019-11-19 – 2019-11-20 (×3): 1 g via INTRAVENOUS
  Filled 2019-11-19 (×3): qty 50

## 2019-11-19 MED ORDER — DEXAMETHASONE SODIUM PHOSPHATE 10 MG/ML IJ SOLN
INTRAMUSCULAR | Status: DC | PRN
  Administered 2019-11-19: 4 mg via INTRAVENOUS

## 2019-11-19 MED ORDER — PHENYLEPHRINE 40 MCG/ML (10ML) SYRINGE FOR IV PUSH (FOR BLOOD PRESSURE SUPPORT)
PREFILLED_SYRINGE | INTRAVENOUS | Status: DC | PRN
  Administered 2019-11-19 (×2): 40 ug via INTRAVENOUS
  Administered 2019-11-19: 80 ug via INTRAVENOUS
  Administered 2019-11-19 (×2): 120 ug via INTRAVENOUS

## 2019-11-19 MED ORDER — ACETAMINOPHEN 325 MG PO TABS: 325.0000 mg | ORAL_TABLET | Freq: Four times a day (QID) | ORAL | Status: DC | PRN

## 2019-11-19 MED ORDER — MIDAZOLAM HCL 2 MG/2ML IJ SOLN: 2.0000 mg | Freq: Once | INTRAMUSCULAR | Status: AC

## 2019-11-19 MED ORDER — OXYCODONE HCL 5 MG PO TABS
ORAL_TABLET | ORAL | Status: AC
  Filled 2019-11-19: qty 1

## 2019-11-19 MED ORDER — SODIUM CHLORIDE 0.9 % IV SOLN: INTRAVENOUS | Status: DC

## 2019-11-19 MED ORDER — MEPERIDINE HCL 25 MG/ML IJ SOLN: 6.2500 mg | INTRAMUSCULAR | Status: DC | PRN

## 2019-11-19 MED ORDER — CEFAZOLIN SODIUM-DEXTROSE 2-4 GM/100ML-% IV SOLN
INTRAVENOUS | Status: AC
  Filled 2019-11-19: qty 100

## 2019-11-19 MED ORDER — MIDAZOLAM HCL 5 MG/5ML IJ SOLN
INTRAMUSCULAR | Status: DC | PRN
  Administered 2019-11-19: 2 mg via INTRAVENOUS

## 2019-11-19 MED ORDER — FENTANYL CITRATE (PF) 100 MCG/2ML IJ SOLN
INTRAMUSCULAR | Status: DC | PRN
  Administered 2019-11-19 (×3): 25 ug via INTRAVENOUS

## 2019-11-19 MED ORDER — CHLORHEXIDINE GLUCONATE 4 % EX LIQD: 60.0000 mL | Freq: Once | CUTANEOUS | Status: DC

## 2019-11-19 MED ORDER — ONDANSETRON HCL 4 MG/2ML IJ SOLN: 4.0000 mg | Freq: Four times a day (QID) | INTRAMUSCULAR | Status: DC | PRN

## 2019-11-19 MED ORDER — MIDAZOLAM HCL 2 MG/2ML IJ SOLN
INTRAMUSCULAR | Status: AC
  Filled 2019-11-19: qty 2

## 2019-11-19 MED ORDER — ONDANSETRON HCL 4 MG PO TABS: 4.0000 mg | ORAL_TABLET | Freq: Four times a day (QID) | ORAL | Status: DC | PRN

## 2019-11-19 MED ORDER — OXYCODONE HCL 5 MG/5ML PO SOLN: 5.0000 mg | Freq: Once | ORAL | Status: DC | PRN

## 2019-11-19 MED ORDER — PROPOFOL 10 MG/ML IV BOLUS
INTRAVENOUS | Status: DC | PRN
  Administered 2019-11-19: 200 mg via INTRAVENOUS

## 2019-11-19 MED ORDER — FENTANYL CITRATE (PF) 100 MCG/2ML IJ SOLN
INTRAMUSCULAR | Status: AC
  Administered 2019-11-19: 100 ug via INTRAVENOUS
  Filled 2019-11-19: qty 2

## 2019-11-19 MED ORDER — FENTANYL CITRATE (PF) 250 MCG/5ML IJ SOLN
INTRAMUSCULAR | Status: AC
  Filled 2019-11-19: qty 5

## 2019-11-19 MED ORDER — BUPIVACAINE HCL 0.25 % IJ SOLN
INTRAMUSCULAR | Status: DC | PRN
  Administered 2019-11-19: 30 mL

## 2019-11-19 MED ORDER — FENTANYL CITRATE (PF) 100 MCG/2ML IJ SOLN: 100.0000 ug | Freq: Once | INTRAMUSCULAR | Status: AC

## 2019-11-19 MED ORDER — FENTANYL CITRATE (PF) 100 MCG/2ML IJ SOLN
INTRAMUSCULAR | Status: AC
  Filled 2019-11-19: qty 2

## 2019-11-19 MED ORDER — DOCUSATE SODIUM 100 MG PO CAPS
100.0000 mg | ORAL_CAPSULE | Freq: Two times a day (BID) | ORAL | Status: DC
  Administered 2019-11-19 – 2019-11-21 (×4): 100 mg via ORAL
  Filled 2019-11-19 (×4): qty 1

## 2019-11-19 MED ORDER — BUPIVACAINE LIPOSOME 1.3 % IJ SUSP
INTRAMUSCULAR | Status: DC | PRN
  Administered 2019-11-19 (×2): 5 mL

## 2019-11-19 MED ORDER — LACTATED RINGERS IV SOLN: INTRAVENOUS | Status: DC

## 2019-11-19 MED ORDER — OXYCODONE HCL 5 MG PO TABS
5.0000 mg | ORAL_TABLET | ORAL | Status: DC | PRN
  Administered 2019-11-19: 5 mg via ORAL
  Administered 2019-11-19 – 2019-11-21 (×4): 10 mg via ORAL
  Filled 2019-11-19 (×6): qty 2

## 2019-11-19 MED ORDER — DEXAMETHASONE SODIUM PHOSPHATE 10 MG/ML IJ SOLN
INTRAMUSCULAR | Status: AC
  Filled 2019-11-19: qty 1

## 2019-11-19 MED ORDER — ONDANSETRON HCL 4 MG/2ML IJ SOLN
INTRAMUSCULAR | Status: AC
  Filled 2019-11-19: qty 2

## 2019-11-19 SURGICAL SUPPLY — 75 items
BANDAGE ESMARK 6X9 LF (GAUZE/BANDAGES/DRESSINGS) ×1 IMPLANT
BIT DRILL 2.6 (BIT) ×1
BIT DRILL 2.6MM (BIT) ×1
BIT DRILL 2.6X VARIAX 2 (BIT) ×1 IMPLANT
BIT DRILL CANN 2.7 (BIT) ×1
BIT DRILL CANN 2.7MM (BIT) ×1
BIT DRILL SRG 2.7XCANN AO CPLG (BIT) ×1 IMPLANT
BIT DRL 2.6X VARIAX 2 (BIT) ×1
BIT DRL SRG 2.7XCANN AO CPLNG (BIT) ×1
BNDG ELASTIC 4X5.8 VLCR STR LF (GAUZE/BANDAGES/DRESSINGS) ×3 IMPLANT
BNDG ELASTIC 6X5.8 VLCR STR LF (GAUZE/BANDAGES/DRESSINGS) ×3 IMPLANT
BNDG ESMARK 6X9 LF (GAUZE/BANDAGES/DRESSINGS) ×3
COVER SURGICAL LIGHT HANDLE (MISCELLANEOUS) ×3 IMPLANT
COVER WAND RF STERILE (DRAPES) ×3 IMPLANT
CUFF TOURN SGL QUICK 34 (TOURNIQUET CUFF) ×2
CUFF TOURN SGL QUICK 42 (TOURNIQUET CUFF) IMPLANT
CUFF TRNQT CYL 34X4.125X (TOURNIQUET CUFF) ×1 IMPLANT
DRAPE C-ARM 42X72 X-RAY (DRAPES) ×3 IMPLANT
DRAPE U-SHAPE 47X51 STRL (DRAPES) ×3 IMPLANT
DURAPREP 26ML APPLICATOR (WOUND CARE) ×3 IMPLANT
ELECT REM PT RETURN 9FT ADLT (ELECTROSURGICAL) ×3
ELECTRODE REM PT RTRN 9FT ADLT (ELECTROSURGICAL) ×1 IMPLANT
GAUZE SPONGE 4X4 12PLY STRL (GAUZE/BANDAGES/DRESSINGS) ×3 IMPLANT
GAUZE SPONGE 4X4 12PLY STRL LF (GAUZE/BANDAGES/DRESSINGS) ×3 IMPLANT
GAUZE XEROFORM 5X9 LF (GAUZE/BANDAGES/DRESSINGS) ×3 IMPLANT
GLOVE BIO SURGEON STRL SZ8 (GLOVE) ×3 IMPLANT
GLOVE BIOGEL PI IND STRL 8 (GLOVE) ×2 IMPLANT
GLOVE BIOGEL PI INDICATOR 8 (GLOVE) ×4
GLOVE ORTHO TXT STRL SZ7.5 (GLOVE) ×3 IMPLANT
GOWN STRL REUS W/ TWL LRG LVL3 (GOWN DISPOSABLE) ×2 IMPLANT
GOWN STRL REUS W/ TWL XL LVL3 (GOWN DISPOSABLE) ×4 IMPLANT
GOWN STRL REUS W/TWL LRG LVL3 (GOWN DISPOSABLE) ×4
GOWN STRL REUS W/TWL XL LVL3 (GOWN DISPOSABLE) ×8
K-WIRE 1.6X150 (WIRE) ×2
K-WIRE FX150X1.6XSMTH TROC (WIRE) ×1
K-WIRE ORTHOPEDIC 1.4X150L (WIRE) ×6
K-WIRE TROCAR POINT 1.25 (WIRE) ×3
KIT BASIN OR (CUSTOM PROCEDURE TRAY) ×3 IMPLANT
KIT TURNOVER KIT B (KITS) ×3 IMPLANT
KWIRE FX150X1.6XSMTH TROC (WIRE) ×1 IMPLANT
KWIRE ORTHOPEDIC 1.4X150L (WIRE) ×2 IMPLANT
KWIRE TROCAR POINT 1.25 (WIRE) ×1 IMPLANT
MANIFOLD NEPTUNE II (INSTRUMENTS) ×3 IMPLANT
NEEDLE HYPO 25GX1X1/2 BEV (NEEDLE) IMPLANT
NS IRRIG 1000ML POUR BTL (IV SOLUTION) ×3 IMPLANT
PACK ORTHO EXTREMITY (CUSTOM PROCEDURE TRAY) ×3 IMPLANT
PAD ARMBOARD 7.5X6 YLW CONV (MISCELLANEOUS) IMPLANT
PAD CAST 4YDX4 CTTN HI CHSV (CAST SUPPLIES) ×1 IMPLANT
PADDING CAST COTTON 4X4 STRL (CAST SUPPLIES) ×2
PADDING CAST COTTON 6X4 STRL (CAST SUPPLIES) ×3 IMPLANT
PADDING CAST SYNTHETIC 4 (CAST SUPPLIES) ×2
PADDING CAST SYNTHETIC 4X4 STR (CAST SUPPLIES) ×1 IMPLANT
PLATE 10H 132MM (Plate) ×3 IMPLANT
SCREW 44X4.0MM (Screw) ×6 IMPLANT
SCREW BONE THRD T10 3.5X12 (Screw) ×18 IMPLANT
SCREW T10 FT 3.5XL40 (Screw) ×3 IMPLANT
SCREW T10 FT 3.5XL50 (Screw) ×3 IMPLANT
SPLINT PLASTER CAST XFAST 5X30 (CAST SUPPLIES) ×1 IMPLANT
SPLINT PLASTER XFAST SET 5X30 (CAST SUPPLIES) ×2
SPONGE LAP 4X18 RFD (DISPOSABLE) ×3 IMPLANT
STOCKINETTE SYNTHETIC 6 UNSTER (CAST SUPPLIES) ×3 IMPLANT
SUCTION FRAZIER HANDLE 10FR (MISCELLANEOUS) ×2
SUCTION TUBE FRAZIER 10FR DISP (MISCELLANEOUS) ×1 IMPLANT
SUT ETHILON 2 0 FS 18 (SUTURE) ×12 IMPLANT
SUT VIC AB 0 CT1 27 (SUTURE) ×8
SUT VIC AB 0 CT1 27XBRD ANBCTR (SUTURE) ×4 IMPLANT
SUT VIC AB 2-0 CT1 27 (SUTURE) ×6
SUT VIC AB 2-0 CT1 TAPERPNT 27 (SUTURE) ×3 IMPLANT
SYR CONTROL 10ML LL (SYRINGE) IMPLANT
TOWEL GREEN STERILE (TOWEL DISPOSABLE) ×3 IMPLANT
TOWEL GREEN STERILE FF (TOWEL DISPOSABLE) ×3 IMPLANT
TUBE CONNECTING 12'X1/4 (SUCTIONS) ×1
TUBE CONNECTING 12X1/4 (SUCTIONS) ×2 IMPLANT
WATER STERILE IRR 1000ML POUR (IV SOLUTION) ×3 IMPLANT
YANKAUER SUCT BULB TIP NO VENT (SUCTIONS) ×3 IMPLANT

## 2019-11-19 NOTE — Anesthesia Procedure Notes (Signed)
Procedure Name: LMA Insertion °Performed by: Isaid Salvia H, CRNA °Pre-anesthesia Checklist: Patient identified, Emergency Drugs available, Suction available and Patient being monitored °Patient Re-evaluated:Patient Re-evaluated prior to induction °Oxygen Delivery Method: Circle System Utilized °Preoxygenation: Pre-oxygenation with 100% oxygen °Induction Type: IV induction °Ventilation: Mask ventilation without difficulty °LMA: LMA inserted °LMA Size: 4.0 °Number of attempts: 1 °Airway Equipment and Method: Bite block °Placement Confirmation: positive ETCO2 °Tube secured with: Tape °Dental Injury: Teeth and Oropharynx as per pre-operative assessment  ° ° ° ° ° ° °

## 2019-11-19 NOTE — Anesthesia Procedure Notes (Addendum)
Anesthesia Regional Block: Popliteal block   Pre-Anesthetic Checklist: ,, timeout performed, Correct Patient, Correct Site, Correct Laterality, Correct Procedure, Correct Position, site marked, Risks and benefits discussed,  Surgical consent,  Pre-op evaluation,  At surgeon's request and post-op pain management  Laterality: Left  Prep: chloraprep       Needles:  Injection technique: Single-shot  Needle Type: Echogenic Stimulator Needle     Needle Length: 5cm  Needle Gauge: 22     Additional Needles:   Procedures:, nerve stimulator,,, ultrasound used (permanent image in chart),,,,   Nerve Stimulator or Paresthesia:  Response: quadraceps contraction, 0.45 mA,   Additional Responses:   Narrative:  Start time: 11/19/2019 1:30 PM End time: 11/19/2019 1:35 PM Injection made incrementally with aspirations every 5 mL.  Performed by: Personally  Anesthesiologist: Bethena Midget, MD  Additional Notes: Functioning IV was confirmed and monitors were applied.  A 57mm 22ga Arrow echogenic stimulator needle was used. Sterile prep and drape,hand hygiene and sterile gloves were used. Ultrasound guidance: relevant anatomy identified, needle position confirmed, local anesthetic spread visualized around nerve(s)., vascular puncture avoided.  Image printed for medical record. Negative aspiration and negative test dose prior to incremental administration of local anesthetic. The patient tolerated the procedure well.

## 2019-11-19 NOTE — Anesthesia Preprocedure Evaluation (Addendum)
Anesthesia Evaluation  Patient identified by MRN, date of birth, ID band Patient awake    Reviewed: Allergy & Precautions, NPO status , Patient's Chart, lab work & pertinent test results  Airway Mallampati: III  TM Distance: >3 FB Neck ROM: Full    Dental no notable dental hx. (+) Dental Advisory Given, Teeth Intact   Pulmonary asthma , Patient abstained from smoking., former smoker,    Pulmonary exam normal breath sounds clear to auscultation       Cardiovascular hypertension, Normal cardiovascular exam Rhythm:Regular Rate:Normal     Neuro/Psych PSYCHIATRIC DISORDERS Anxiety Depression negative neurological ROS     GI/Hepatic negative GI ROS, Neg liver ROS,   Endo/Other  Morbid obesity  Renal/GU negative Renal ROS  negative genitourinary   Musculoskeletal negative musculoskeletal ROS (+)   Abdominal   Peds  Hematology negative hematology ROS (+)   Anesthesia Other Findings   Reproductive/Obstetrics                            Anesthesia Physical Anesthesia Plan  ASA: III  Anesthesia Plan: General and Regional   Post-op Pain Management:  Regional for Post-op pain   Induction: Intravenous  PONV Risk Score and Plan: 3 and Ondansetron, Dexamethasone and Midazolam  Airway Management Planned: LMA  Additional Equipment:   Intra-op Plan:   Post-operative Plan: Extubation in OR  Informed Consent: I have reviewed the patients History and Physical, chart, labs and discussed the procedure including the risks, benefits and alternatives for the proposed anesthesia with the patient or authorized representative who has indicated his/her understanding and acceptance.     Dental advisory given  Plan Discussed with: CRNA  Anesthesia Plan Comments:         Anesthesia Quick Evaluation

## 2019-11-19 NOTE — Brief Op Note (Signed)
11/19/2019  3:45 PM  PATIENT:  Lisa Espinoza  24 y.o. female  PRE-OPERATIVE DIAGNOSIS:  left bimalleolar ankle fracture  POST-OPERATIVE DIAGNOSIS:  left bimalleolar ankle fracture  PROCEDURE:  Procedure(s): OPEN REDUCTION INTERNAL FIXATION (ORIF) LEFT ANKLE FRACTURE, STABILIZATION OF SYNDESMOSIS (Left)  SURGEON:  Surgeon(s) and Role:    Kathryne Hitch, MD - Primary  PHYSICIAN ASSISTANT:  Rexene Edison, PA-C  ANESTHESIA:   regional and general  COUNTS:  YES  TOURNIQUET:  * Missing tourniquet times found for documented tourniquets in log: [id number] *  DICTATION: .Other Dictation: Dictation Number [id number]  PLAN OF CARE: Admit for overnight observation  PATIENT DISPOSITION:  PACU - hemodynamically stable.   Delay start of Pharmacological VTE agent (>24hrs) due to surgical blood loss or risk of bleeding: no

## 2019-11-19 NOTE — H&P (Signed)
Lisa Espinoza is an 24 y.o. female.   Chief Complaint: Left ankle fracture with significant left ankle pain HPI: The patient is a 24 year old female who sustained a left ankle fracture dislocation after mechanical fall this past week.  She was seen in the emergency room and the ER staff was able to reduce the fracture and placed her appropriately in a splint.  We had her stay off of this ankle with ice and elevation for the last several days to allow for the soft tissue swelling to subside.  She now presents for definitive fixation of this highly unstable left ankle fracture.  Past Medical History:  Diagnosis Date  . Anxiety   . Asthma   . Depression   . Fracture of left ankle    bimalleolar  . Hypertension   . Pneumonia    as a child  . Wears glasses     History reviewed. No pertinent surgical history.  Family History  Family history unknown: Yes   Social History:  reports that she quit smoking about 2 months ago. Her smoking use included cigarettes. She has never used smokeless tobacco. She reports previous alcohol use. She reports previous drug use.  Allergies: No Known Allergies  Medications Prior to Admission  Medication Sig Dispense Refill  . acetaminophen (TYLENOL) 325 MG tablet Take 650 mg by mouth every 6 (six) hours as needed for mild pain or headache.    . cetirizine (ZYRTEC) 10 MG tablet Take 10 mg by mouth daily.    Marland Kitchen ibuprofen (ADVIL) 800 MG tablet Take 1 tablet (800 mg total) by mouth 3 (three) times daily. 21 tablet 0  . JUNEL 1.5/30 1.5-30 MG-MCG tablet Take 1 tablet by mouth daily.    Marland Kitchen oxyCODONE (ROXICODONE) 5 MG immediate release tablet Take 1-2 tablets (5-10 mg total) by mouth every 6 (six) hours as needed for severe pain. 30 tablet 0    Results for orders placed or performed during the hospital encounter of 11/19/19 (from the past 48 hour(s))  CBC     Status: Abnormal   Collection Time: 11/19/19 12:25 PM  Result Value Ref Range   WBC 11.6 (H) 4.0 - 10.5  K/uL   RBC 4.41 3.87 - 5.11 MIL/uL   Hemoglobin 14.6 12.0 - 15.0 g/dL   HCT 42.9 36.0 - 46.0 %   MCV 97.3 80.0 - 100.0 fL   MCH 33.1 26.0 - 34.0 pg   MCHC 34.0 30.0 - 36.0 g/dL   RDW 12.6 11.5 - 15.5 %   Platelets 380 150 - 400 K/uL   nRBC 0.0 0.0 - 0.2 %    Comment: Performed at [facility], [place] [address], [place], [place] [address]  Basic metabolic panel     Status: Abnormal   Collection Time: 11/19/19 12:25 PM  Result Value Ref Range   Sodium 139 135 - 145 mmol/L   Potassium 4.2 3.5 - 5.1 mmol/L   Chloride 107 98 - 111 mmol/L   CO2 17 (L) 22 - 32 mmol/L   Glucose, Bld 82 70 - 99 mg/dL   BUN 13 6 - 20 mg/dL   Creatinine, Ser 1.01 (H) 0.44 - 1.00 mg/dL   Calcium 9.1 8.9 - 10.3 mg/dL   GFR calc non Af Amer >60 >60 mL/min   GFR calc Af Amer >60 >60 mL/min   Anion gap 15 5 - 15    Comment: Performed at [facility] [address] [place], [place] [address]  Pregnancy,  urine POC     Status: None   Collection Time: 11/19/19 12:48 PM  Result Value Ref Range   Preg Test, Ur NEGATIVE NEGATIVE    Comment:        THE SENSITIVITY OF THIS METHODOLOGY IS >24 mIU/mL    No results found.  Review of Systems  All other systems reviewed and are negative.   Blood pressure (!) 134/91, pulse (!) 54, temperature 98.5 F (36.9 C), temperature source Oral, resp. rate 19, height 5\' 6"  (1.676 m), weight 122.5 kg, last menstrual period 10/29/2019, SpO2 95 %. Physical Exam  Constitutional: She is oriented to person, place, and time. She appears well-developed and well-nourished.  HENT:  Head: Normocephalic and atraumatic.  Eyes: Pupils are equal, round, and reactive to light. EOM are normal.  Cardiovascular: Normal rate and regular rhythm.  Respiratory: Effort normal and breath sounds normal.  GI: Soft. Bowel sounds are normal.  Musculoskeletal:     Cervical back: Normal range of motion and neck supple.     Left ankle: Swelling, deformity and ecchymosis present. Tenderness  present over the lateral malleolus and medial malleolus. Decreased range of motion.  Neurological: She is alert and oriented to person, place, and time.  Skin: Skin is warm and dry.  Psychiatric: She has a normal mood and affect.     Assessment/Plan Left ankle fracture dislocation with fractures the medial lateral malleolus and likely disruption of the ankle syndesmosis  The plan will be to proceed to surgery today for open reduction-internal fixation of her left ankle fracture.  This will include fixation of both the lateral and medial malleolus as well as possibly stabilizing the syndesmosis as indicated intraoperatively.  The risk and benefits of been explained her in detail about the surgery.  All question concerns were answered and addressed.  Informed consent is obtained.  10/31/2019, MD 11/19/2019, 1:26 PM

## 2019-11-19 NOTE — Anesthesia Procedure Notes (Signed)
Anesthesia Regional Block: Adductor canal block   Pre-Anesthetic Checklist: ,, timeout performed, Correct Patient, Correct Site, Correct Laterality, Correct Procedure, Correct Position, site marked, Risks and benefits discussed,  Surgical consent,  Pre-op evaluation,  At surgeon's request and post-op pain management  Laterality: Left  Prep: chloraprep       Needles:  Injection technique: Single-shot  Needle Type: Echogenic Stimulator Needle     Needle Length: 5cm  Needle Gauge: 22     Additional Needles:   Procedures:, nerve stimulator,,, ultrasound used (permanent image in chart),,,,  Narrative:  Start time: 11/19/2019 1:20 PM End time: 11/19/2019 1:25 PM Injection made incrementally with aspirations every 5 mL.  Performed by: Personally  Anesthesiologist: Heather Roberts, MD  Additional Notes: Functioning IV was confirmed and monitors were applied.  A 107mm 22ga Arrow echogenic stimulator needle was used. Sterile prep and drape,hand hygiene and sterile gloves were used. Ultrasound guidance: relevant anatomy identified, needle position confirmed, local anesthetic spread visualized around nerve(s)., vascular puncture avoided.  Image printed for medical record. Negative aspiration and negative test dose prior to incremental administration of local anesthetic. The patient tolerated the procedure well.

## 2019-11-19 NOTE — Op Note (Signed)
[name], Espinoza MEDICAL RECORD IP:[id number] ACCOUNT [id number] DATE OF BIRTH:09-Aug-1996 FACILITY: [facility] LOCATION: [id number] PHYSICIAN:Ahmya Bernick Aretha Parrot, MD  OPERATIVE REPORT  DATE OF PROCEDURE:  11/19/2019  PREOPERATIVE DIAGNOSIS:  Left bimalleolar ankle fracture dislocation with syndesmosis disruption.  POSTOPERATIVE DIAGNOSIS:  Left bimalleolar ankle fracture dislocation with syndesmosis disruption.  PROCEDURE: 1.  Open reduction and internal fixation of left bimalleolar ankle fracture. 2.  Surgical stabilization of the left ankle syndesmosis.  SURGEON:  Vanita Panda. Magnus Ivan, MD  ASSISTANT:  Richardean Canal, PA-C.  ANESTHESIA: 1.  Left lower extremity block. 2.  General.  TOURNIQUET TIME:  Just over 1 hour.  ESTIMATED BLOOD LOSS:  Minimal.  COMPLICATIONS:  None.  ANTIBIOTICS:  Two g IV Ancef.  INDICATIONS:  The patient is a 24 year old female who sustained a fracture dislocation of her left ankle when she was moving some furniture a week ago.  She was seen in the emergency room and found to have a complex ankle fracture dislocation with  syndesmosis disruption.  This was reduced and splinted appropriately by the ER staff.  After a period of elevation and nonweightbearing to allow for edema reduction, she presents for definitive fixation of this ankle injury.  We had a long and thorough  discussion about the surgery and what it involves and discussion of the risks and benefits.  She understands there is risk of infection, acute blood loss anemia, nerve and vessel injury, fracture nonunion and posttraumatic arthritis.  She understands our  goals are stabilizing the ankle and decrease pain and improve mobility over time.  DESCRIPTION OF PROCEDURE:  After informed consent was obtained, the appropriate left ankle was marked.  Anesthesia obtained a regional block of the left lower extremity in the holding room.  She was then brought to the operating room and placed  supine on  the operating table.  General anesthesia was then obtained.  A nonsterile tourniquet was placed around her upper left thigh and her left thigh, knee, leg, ankle and foot were prepped and draped with DuraPrep and sterile drapes.  A timeout was called to  identify correct patient and correct left ankle.  We then used an Esmarch to wrap about the ankle and tourniquet was inflated to 250 mm of pressure.  We then made an incision over the fibula and carried this proximally and distally.  We dissected down  the fibular fracture and it was fractured well proximal to the syndesmosis.  It was a comminuted fracture.  We had to temporarily hold it in place with a K-wire and used 0 Vicryl sutures around the mid fracture fragments to hold them in place.  It was  held out to length and we assessed this fluoroscopically.  We then chose a very long Stryker one-third semitubular plate, which was a multi-hole plate and we fashioned this to the lateral cortex of the fibula.  We then secured this with bicortical screws  proximally and distally, holding the fracture out to length.  We reserved 2 distal screws for syndesmotic screw placement.  The placement of this plate was done under direct fluoroscopy and visualization.  Attention was then turned to the medial  malleolus piece.  We made an incision over the medial malleolus and dissected down to the ankle joint itself.  We were able to irrigate out the ankle joint and did find some cartilage debris from an injury to the cartilage of the dome of the talus.   After we irrigated the ankle joint, we held the fracture in  a reduced position on the medial malleolus and placed 2 temporary guide pins in a parallel format.  We then took measurements off of this and placed 2 partially threaded 4.0 cancellous screws to  hold the medial malleolus piece in place.  Next, we held the foot in a dorsiflexed position and placed 2 fully threaded syndesmosis screws from the lateral  fibula going into the tibia from a posterior to anterior directed position.  We then stressed the  ankle joint under direct fluoroscopy and found the mortise to be congruent and intact.  We then irrigated both wounds with normal saline solution.  We closed the deep tissue over the plate with 0 Vicryl, followed by 2-0 Vicryl to close the subcutaneous  tissue.  We did the same closure on the medial side.  The skin on both incisions was reapproximated with 2-0 nylon suture.  Xeroform, a well-padded sterile dressing and a plaster splint were placed on her ankle.  The tourniquet was let down, her toes  pinked in nicely.  She was awakened, extubated and taken to recovery room in stable condition.  All final counts were correct.  There no complications noted.  Postoperatively, we are going to admit her overnight for IV antibiotics, as well as the need  for physical therapy to help with her mobility because she will be nonweightbearing for the next 4-6 weeks.  They can help also identified what her home health durable medical equipment needs may be.  Of note, Lisa Stabile, PA-C, assisted in the entire  case.  His assistance was crucial for facilitating all aspects of this case.  VN/NUANCE  D:11/19/2019 T:11/19/2019 JOB:[id number]

## 2019-11-19 NOTE — Transfer of Care (Signed)
Immediate Anesthesia Transfer of Care Note  Patient: Lisa Espinoza  Procedure(s) Performed: OPEN REDUCTION INTERNAL FIXATION (ORIF) LEFT ANKLE FRACTURE, STABILIZATION OF SYNDESMOSIS (Left Ankle)  Patient Location: PACU  Anesthesia Type:General  Level of Consciousness: awake  Airway & Oxygen Therapy: Patient Spontanous Breathing  Post-op Assessment: Report given to RN and Post -op Vital signs reviewed and stable  Post vital signs: Reviewed and stable  Last Vitals:  Vitals Value Taken Time  BP 114/65 11/19/19 1613  Temp    Pulse 97 11/19/19 1614  Resp 26 11/19/19 1614  SpO2 98 % 11/19/19 1614  Vitals shown include unvalidated device data.  Last Pain:  Vitals:   11/19/19 1225  TempSrc:   PainSc: 7       Patients Stated Pain Goal: 3 (11/19/19 1225)  Complications: No apparent anesthesia complications

## 2019-11-19 NOTE — Plan of Care (Signed)

## 2019-11-20 DIAGNOSIS — Z20822 Contact with and (suspected) exposure to covid-19: Secondary | ICD-10-CM | POA: Diagnosis present

## 2019-11-20 DIAGNOSIS — S9305XA Dislocation of left ankle joint, initial encounter: Secondary | ICD-10-CM | POA: Diagnosis present

## 2019-11-20 DIAGNOSIS — S93432A Sprain of tibiofibular ligament of left ankle, initial encounter: Secondary | ICD-10-CM | POA: Diagnosis present

## 2019-11-20 DIAGNOSIS — I10 Essential (primary) hypertension: Secondary | ICD-10-CM | POA: Diagnosis present

## 2019-11-20 DIAGNOSIS — W1830XA Fall on same level, unspecified, initial encounter: Secondary | ICD-10-CM | POA: Diagnosis present

## 2019-11-20 DIAGNOSIS — G8918 Other acute postprocedural pain: Secondary | ICD-10-CM | POA: Diagnosis not present

## 2019-11-20 DIAGNOSIS — J45909 Unspecified asthma, uncomplicated: Secondary | ICD-10-CM | POA: Diagnosis present

## 2019-11-20 DIAGNOSIS — F17210 Nicotine dependence, cigarettes, uncomplicated: Secondary | ICD-10-CM | POA: Diagnosis present

## 2019-11-20 DIAGNOSIS — S82842A Displaced bimalleolar fracture of left lower leg, initial encounter for closed fracture: Secondary | ICD-10-CM | POA: Diagnosis present

## 2019-11-20 DIAGNOSIS — F329 Major depressive disorder, single episode, unspecified: Secondary | ICD-10-CM | POA: Diagnosis present

## 2019-11-20 DIAGNOSIS — F419 Anxiety disorder, unspecified: Secondary | ICD-10-CM | POA: Diagnosis present

## 2019-11-20 DIAGNOSIS — Z6841 Body Mass Index (BMI) 40.0 and over, adult: Secondary | ICD-10-CM | POA: Diagnosis not present

## 2019-11-20 MED ORDER — OXYCODONE HCL 5 MG PO TABS: 5.0000 mg | ORAL_TABLET | Freq: Four times a day (QID) | ORAL | 0 refills | Status: DC | PRN

## 2019-11-20 MED ORDER — KETOROLAC TROMETHAMINE 15 MG/ML IJ SOLN
15.0000 mg | Freq: Once | INTRAMUSCULAR | Status: AC
  Administered 2019-11-20: 15 mg via INTRAVENOUS
  Filled 2019-11-20: qty 1

## 2019-11-20 NOTE — Progress Notes (Signed)
Patient ID: Lisa Espinoza, female   DOB: 07/16/1996, 24 y.o.   MRN: [id number] The patient is having a hard time with postoperative pain control and mobility in general.  She will need to be made an inpatient admission.  There is concern about her living on the second floor apartment.  Therapy you will help with assessing what her home health needs may be.  A wheelchair as needed for home use as well as the likely need for an ambulance service to help transport her home and get her into her second floor apartment.  We will have nursing place an IV again and give her at least a one-time dose of Toradol to see if this will help with pain control.

## 2019-11-20 NOTE — Progress Notes (Signed)
PT Progress Note for Wheelchair Recommendation  Patient is s/p ORIF L ankle and NWB'ing L LE which impairs their ability to perform daily activities in the home.  A walker alone will not resolve the issues with performing activities of daily living. A wheelchair will allow patient to safely perform daily activities.  The patient can self propel in the home or has a caregiver who can provide assistance.   Arletta Bale, DPT  Acute Rehabilitation Services Pager [phone] Office [phone]

## 2019-11-20 NOTE — Evaluation (Signed)
Physical Therapy Evaluation Patient Details Name: Lisa Espinoza MRN: [id number] DOB: 1995-12-06 Today's Date: 11/20/2019   History of Present Illness  Pt is a 24 y/o female s/p ORIF L ankle secondary to a mechanical fall while moving furniture. PMH including but not limited to asthma, anxiety and HTN.    Clinical Impression  Pt presented supine in bed with HOB elevated, awake and willing to participate in therapy session. Prior to admission, pt has been ambulating with crutches since initial injury a couple of days ago. She has needed some assistance from her boyfriend for lower body bathing. She lives with her boyfriend in a single level apartment on the second floor (full flight of stairs, no elevator). At the time of evaluation, pt significantly limited with mobility secondary to pain (in tears when standing and hopping). Pt's RN notified of pt's request of pain meds. PT demonstrated and instructed pt in ascending/descending stairs in the seated position as pt reported that this has been a major issue since her injury. PT explained to pt that the seated position might be more comfortable and easier to manage during this time when her pain is elevated as hopping on one LE for a full flight of stairs can be very difficult. PT planning to return to see pt for a second session today prior to d/c'ing home potentially today.     Follow Up Recommendations Home health PT;Supervision/Assistance - 24 hour    Equipment Recommendations  Rolling walker with 5" wheels    Recommendations for Other Services       Precautions / Restrictions Precautions Precautions: Fall Restrictions Weight Bearing Restrictions: Yes LLE Weight Bearing: Non weight bearing      Mobility  Bed Mobility Overal bed mobility: Needs Assistance Bed Mobility: Supine to Sit;Sit to Supine     Supine to sit: Supervision Sit to supine: Supervision   General bed mobility comments: for safety  Transfers Overall transfer  level: Needs assistance Equipment used: Rolling walker (2 wheeled) Transfers: Sit to/from Stand Sit to Stand: Min guard         General transfer comment: cueing for technique, steady with transition  Ambulation/Gait Ambulation/Gait assistance: Min guard   Assistive device: Rolling walker (2 wheeled) Gait Pattern/deviations: (hop-to on R LE)     General Gait Details: greatly limited secondary to pain (pt in tears); steady with hopping and use of RW  Stairs            Wheelchair Mobility    Modified Rankin (Stroke Patients Only)       Balance Overall balance assessment: Needs assistance Sitting-balance support: No upper extremity supported Sitting balance-Leahy Scale: Good     Standing balance support: Bilateral upper extremity supported Standing balance-Leahy Scale: Poor                               Pertinent Vitals/Pain Pain Assessment: 0-10 Pain Score: 7  Pain Location: ankle Pain Descriptors / Indicators: Aching Pain Intervention(s): Monitored during session;Repositioned    Home Living Family/patient expects to be discharged to:: Private residence Living Arrangements: Spouse/significant other Available Help at Discharge: Family;Available 24 hours/day;Other (Comment)(until Saturday (2/13)) Type of Home: Apartment Home Access: Stairs to enter   Entrance Stairs-Number of Steps: flight Home Layout: One level Home Equipment: Crutches;Tub bench;Grab bars - tub/shower      Prior Function Level of Independence: Needs assistance   Gait / Transfers Assistance Needed: ambulating with crutches  ADL's / Homemaking  Assistance Needed: needed some assistance with lower body bathing        Hand Dominance        Extremity/Trunk Assessment   Upper Extremity Assessment Upper Extremity Assessment: Overall WFL for tasks assessed    Lower Extremity Assessment Lower Extremity Assessment: LLE deficits/detail LLE Deficits / Details: pt with  decreased strength and ROM limitations secondary to post-op pain and weakness LLE: Unable to fully assess due to immobilization;Unable to fully assess due to pain LLE Sensation: decreased light touch       Communication   Communication: No difficulties  Cognition Arousal/Alertness: Awake/alert Behavior During Therapy: WFL for tasks assessed/performed Overall Cognitive Status: Within Functional Limits for tasks assessed                                 General Comments: tearful with movement due to pain      General Comments      Exercises     Assessment/Plan    PT Assessment Patient needs continued PT services  PT Problem List Decreased strength;Decreased range of motion;Decreased activity tolerance;Decreased balance;Decreased mobility;Decreased coordination;Decreased knowledge of use of DME;Decreased safety awareness;Decreased knowledge of precautions;Pain       PT Treatment Interventions DME instruction;Gait training;Stair training;Therapeutic activities;Therapeutic exercise;Functional mobility training;Balance training;Neuromuscular re-education;Patient/family education    PT Goals (Current goals can be found in the Care Plan section)  Acute Rehab PT Goals Patient Stated Goal: decrease pain PT Goal Formulation: With patient Time For Goal Achievement: 12/04/19 Potential to Achieve Goals: Good    Frequency Min 5X/week   Barriers to discharge        Co-evaluation               AM-PAC PT "6 Clicks" Mobility  Outcome Measure Help needed turning from your back to your side while in a flat bed without using bedrails?: None Help needed moving from lying on your back to sitting on the side of a flat bed without using bedrails?: None Help needed moving to and from a bed to a chair (including a wheelchair)?: A Little Help needed standing up from a chair using your arms (e.g., wheelchair or bedside chair)?: None Help needed to walk in hospital room?: A  Little Help needed climbing 3-5 steps with a railing? : A Lot 6 Click Score: 20    End of Session Equipment Utilized During Treatment: Gait belt Activity Tolerance: Patient limited by pain Patient left: in bed;with call bell/phone within reach Nurse Communication: Mobility status PT Visit Diagnosis: Other abnormalities of gait and mobility (R26.89);Pain Pain - Right/Left: Left Pain - part of body: Ankle and joints of foot    Time: 1540-0867 PT Time Calculation (min) (ACUTE ONLY): 21 min   Charges:   PT Evaluation $PT Eval Moderate Complexity: 1 Mod          Eduard Clos, PT, DPT  Acute Rehabilitation Services Pager [phone] Office [place] 11/20/2019, 11:14 AM

## 2019-11-20 NOTE — Discharge Summary (Signed)
Patient ID: Lisa Espinoza MRN: [id number] DOB/AGE: November 26, 1995 24 y.o.  Admit date: 11/19/2019 Discharge date: 11/20/2019  Admission Diagnoses:  Principal Problem:   Bimalleolar ankle fracture, left, closed, initial encounter Active Problems:   Ankle fracture, bimalleolar, closed, left, initial encounter   Discharge Diagnoses:  Same  Past Medical History:  Diagnosis Date  . Anxiety   . Asthma   . Depression   . Fracture of left ankle    bimalleolar  . Hypertension   . Pneumonia    as a child  . Wears glasses     Surgeries: Procedure(s): OPEN REDUCTION INTERNAL FIXATION (ORIF) LEFT ANKLE FRACTURE, STABILIZATION OF SYNDESMOSIS on 11/19/2019   Consultants:   Discharged Condition: Improved  Hospital Course: Lisa Espinoza is an 24 y.o. female who was admitted 11/19/2019 for operative treatment ofBimalleolar ankle fracture, left, closed, initial encounter. Patient has severe unremitting pain that affects sleep, daily activities, and work/hobbies. After pre-op clearance the patient was taken to the operating room on 11/19/2019 and underwent  Procedure(s): OPEN REDUCTION INTERNAL FIXATION (ORIF) LEFT ANKLE FRACTURE, STABILIZATION OF SYNDESMOSIS.    Patient was given perioperative antibiotics:  Anti-infectives (From admission, onward)   Start     Dose/Rate Route Frequency Ordered Stop   11/19/19 1745  ceFAZolin (ANCEF) IVPB 1 g/50 mL premix     1 g 100 mL/hr over 30 Minutes Intravenous Every 6 hours 11/19/19 1730 11/20/19 0637   11/19/19 1415  ceFAZolin (ANCEF) IVPB 2g/100 mL premix     2 g 200 mL/hr over 30 Minutes Intravenous On call to O.R. 11/19/19 1155 11/19/19 1410   11/19/19 1202  ceFAZolin (ANCEF) 2-4 GM/100ML-% IVPB    Note to Pharmacy: Dia Crawford   : cabinet override      11/19/19 1202 11/19/19 1422       Patient was given sequential compression devices, early ambulation, and chemoprophylaxis to prevent DVT.  Patient benefited maximally from hospital stay and  there were no complications.    Recent vital signs:  Patient Vitals for the past 24 hrs:  BP Temp Temp src Pulse Resp SpO2 Height Weight  11/20/19 0500 (!) 151/102 97.9 F (36.6 C) Oral 91 18 99 % -- --  11/20/19 0000 131/85 98.5 F (36.9 C) Oral 91 18 97 % -- --  11/19/19 2100 124/90 97.9 F (36.6 C) Oral 87 18 99 % -- --  11/19/19 2032 124/90 97.9 F (36.6 C) Oral 90 18 99 % -- --  11/19/19 1732 125/76 98.7 F (37.1 C) Oral 88 18 100 % -- --  11/19/19 1715 119/90 (!) 97.5 F (36.4 C) -- 95 19 99 % -- --  11/19/19 1658 120/81 -- -- 89 19 100 % -- --  11/19/19 1643 119/76 -- -- 90 20 98 % -- --  11/19/19 1629 126/83 -- -- 85 17 95 % -- --  11/19/19 1615 114/65 (!) 97.2 F (36.2 C) -- 96 (!) 21 99 % -- --  11/19/19 1325 137/78 -- -- 89 12 100 % -- --  11/19/19 1320 121/87 -- -- 81 16 100 % -- --  11/19/19 1315 (!) 144/100 -- -- 95 (!) 22 100 % -- --  11/19/19 1310 (!) 183/132 -- -- (!) 106 (!) 21 100 % -- --  11/19/19 1305 (!) 167/115 -- -- 100 (!) 21 100 % -- --  11/19/19 1213 (!) 134/91 98.5 F (36.9 C) Oral (!) 54 19 95 % 5\' 6"  (1.676 m) 122.5 kg     Recent laboratory  studies:  Recent Labs    11/19/19 1225  WBC 11.6*  HGB 14.6  HCT 42.9  PLT 380  NA 139  K 4.2  CL 107  CO2 17*  BUN 13  CREATININE 1.01*  GLUCOSE 82  CALCIUM 9.1     Discharge Medications:   Allergies as of 11/20/2019   No Known Allergies     Medication List    STOP taking these medications   ibuprofen 800 MG tablet Commonly known as: ADVIL     TAKE these medications   acetaminophen 325 MG tablet Commonly known as: TYLENOL Take 650 mg by mouth every 6 (six) hours as needed for mild pain or headache.   cetirizine 10 MG tablet Commonly known as: ZYRTEC Take 10 mg by mouth daily.   Junel 1.5/30 1.5-30 MG-MCG tablet Generic drug: Norethindrone Acetate-Ethinyl Estradiol Take 1 tablet by mouth daily.   oxyCODONE 5 MG immediate release tablet Commonly known as: Roxicodone Take 1-2  tablets (5-10 mg total) by mouth every 6 (six) hours as needed for severe pain.            Durable Medical Equipment  (From admission, onward)         Start     Ordered   11/19/19 1731  DME 3 n 1  Once     11/19/19 1730   11/19/19 1731  DME Walker rolling  Once    Question Answer Comment  Walker: With 5 Inch Wheels   Patient needs a walker to treat with the following condition Ankle fracture, bimalleolar, closed, left, initial encounter      11/19/19 1730          Diagnostic Studies: DG Ankle 2 Views Left  Result Date: 11/12/2019 CLINICAL DATA:  Status post reduction of the left ankle. EXAM: LEFT ANKLE - 2 VIEW COMPARISON:  November 12, 2019 FINDINGS: Acute fracture deformities are seen involving the left medial malleolus and distal shaft of the left fibula. Gross anatomic alignment is seen. There is been interval reduction of the previously noted left ankle dislocation. Mild diffuse soft tissue swelling is seen. IMPRESSION: 1. Acute fractures of the left medial malleolus and distal left fibula with interval reduction of the previously noted left ankle dislocation seen on the prior study dated November 12, 2019 (acquired at 5:34 p.m. Electronically Signed   By: Aram Candela M.D.   On: 11/12/2019 18:54   DG Ankle Complete Left  Result Date: 11/19/2019 CLINICAL DATA:  Left ankle fracture EXAM: LEFT ANKLE COMPLETE - 3+ VIEW; DG C-ARM 1-60 MIN COMPARISON:  11/12/2019 FINDINGS: Four fluoroscopic images are obtained during the performance of the procedure and are provided for interpretation only. Images demonstrate plate and screw fixation across the distal fibular fracture. Two screws traverse the tibiofibular syndesmosis. There are 2 cannulated screws traversing the medial malleolar fracture. Alignment is near anatomic. FLUOROSCOPY TIME:  17 seconds IMPRESSION: 1. ORIF left ankle fractures as above. Electronically Signed   By: Sharlet Salina M.D.   On: 11/19/2019 16:17   DG Ankle  Complete Left  Result Date: 11/12/2019 CLINICAL DATA:  Status post trauma. EXAM: LEFT ANKLE COMPLETE - 3+ VIEW COMPARISON:  None. FINDINGS: A displaced fracture deformity is seen involving the left medial malleolus. An additional displaced fracture of the distal shaft of the left fibula is noted with anteromedial angulation of the fracture apex. There is disruption of the left ankle mortise with posterolateral dislocation of the left ankle. Moderate severity diffuse soft tissue swelling is  noted. IMPRESSION: 1. Displaced fractures of the left medial malleolus and distal shaft of the left fibula. 2. Disruption of the left ankle mortise with posterolateral dislocation of the left ankle. Electronically Signed   By: Virgina Norfolk M.D.   On: 11/12/2019 18:34   DG C-Arm 1-60 Min  Result Date: 11/19/2019 CLINICAL DATA:  Left ankle fracture EXAM: LEFT ANKLE COMPLETE - 3+ VIEW; DG C-ARM 1-60 MIN COMPARISON:  11/12/2019 FINDINGS: Four fluoroscopic images are obtained during the performance of the procedure and are provided for interpretation only. Images demonstrate plate and screw fixation across the distal fibular fracture. Two screws traverse the tibiofibular syndesmosis. There are 2 cannulated screws traversing the medial malleolar fracture. Alignment is near anatomic. FLUOROSCOPY TIME:  17 seconds IMPRESSION: 1. ORIF left ankle fractures as above. Electronically Signed   By: Randa Ngo M.D.   On: 11/19/2019 16:17    Disposition: Discharge disposition: 01-Home or Self Care         Follow-up Information    Mcarthur Rossetti, MD. Schedule an appointment as soon as possible for a visit in 2 week(s).   Specialty: Orthopedic Surgery Contact information: [address] [place] [address] [phone]            Signed: Mcarthur Rossetti 11/20/2019, 7:24 AM

## 2019-11-20 NOTE — Discharge Instructions (Signed)
Keep your splint clean and dry. Ice and elevation as needed for swelling. Do take a baby aspirin (81 mg) twice daily. Do not put weight on your left ankle until further notice.

## 2019-11-20 NOTE — Progress Notes (Signed)
Patient ID: Lisa Espinoza, female   DOB: 13-Jun-1996, 24 y.o.   MRN: [id number] The patient tolerated surgery well yesterday on her left broken ankle. Her splint is intact this morning. Her toes are well perfused. Our plan is to get her up with therapy today with nonweightbearing on the left lower extremity. If she is doing well with therapy she can be discharged to home this afternoon.

## 2019-11-20 NOTE — Progress Notes (Signed)
Physical Therapy Progress Note  Clinical Impression: Pt remains significantly limited with mobility secondary to pain (pt in tears with all mobility). PT assisted pt with L LE elevation and ice application at end of session. Pt's mother present throughout session, voicing concerns about pt's pain level and questionable discharge. Requesting ambulance transport home with assistance for stairs to enter her apartment. Updated DME recommendations as pt having great difficulty with progressing ambulation and will benefit from a w/c with elevating leg rests as well as a 3-in-1. Pt also would benefit from f/u HHPT services upon d/c.  Pt would continue to benefit from skilled physical therapy services at this time while admitted and after d/c to address the below listed limitations in order to improve overall safety and independence with functional mobility.    11/20/19 1300  PT Visit Information  Last PT Received On 11/20/19  Assistance Needed +1  History of Present Illness Pt is a 24 y/o female s/p ORIF L ankle secondary to a mechanical fall while moving furniture. PMH including but not limited to asthma, anxiety and HTN.  Precautions  Precautions Fall  Restrictions  Weight Bearing Restrictions Yes  LLE Weight Bearing NWB  Pain Assessment  Pain Assessment Faces  Faces Pain Scale 10  Pain Location L ankle  Pain Descriptors / Indicators Aching  Pain Intervention(s) Monitored during session;Repositioned  Cognition  Arousal/Alertness Awake/alert  Behavior During Therapy WFL for tasks assessed/performed  Overall Cognitive Status Within Functional Limits for tasks assessed  General Comments tearful with movement due to pain  Bed Mobility  Overal bed mobility Needs Assistance  Bed Mobility Supine to Sit;Sit to Supine  Supine to sit Min assist  Sit to supine Min assist  General bed mobility comments min A for support of L LE with movement  Transfers  Overall transfer level Needs assistance   Equipment used Rolling walker (2 wheeled)  Transfers Sit to/from Stand  Sit to Stand Min guard  General transfer comment cueing for technique, steady with transition  Ambulation/Gait  Ambulation/Gait assistance Min guard  Assistive device Rolling walker (2 wheeled)  Gait Pattern/deviations  (hop-to on R LE)  General Gait Details again greatly limited secondary to pain (pt in tears) to only 2-3 hops forwards and backwards; steady with hopping and use of RW  Balance  Overall balance assessment Needs assistance  Sitting-balance support No upper extremity supported  Sitting balance-Leahy Scale Good  Standing balance support Bilateral upper extremity supported  Standing balance-Leahy Scale Poor  PT - End of Session  Equipment Utilized During Treatment Gait belt  Activity Tolerance Patient limited by pain  Patient left in bed;with call bell/phone within reach;with family/visitor present  Nurse Communication Mobility status   PT - Assessment/Plan  PT Plan Current plan remains appropriate  PT Visit Diagnosis Other abnormalities of gait and mobility (R26.89);Pain  Pain - Right/Left Left  Pain - part of body Ankle and joints of foot  PT Frequency (ACUTE ONLY) Min 5X/week  Follow Up Recommendations Home health PT;Supervision/Assistance - 24 hour  PT equipment Rolling walker with 5" wheels;3in1 (PT);Wheelchair (measurements PT);Wheelchair cushion (measurements PT);Other (comment) (w/c with elevating leg rests)  AM-PAC PT "6 Clicks" Mobility Outcome Measure (Version 2)  Help needed turning from your back to your side while in a flat bed without using bedrails? 4  Help needed moving from lying on your back to sitting on the side of a flat bed without using bedrails? 3  Help needed moving to and from a bed to a  chair (including a wheelchair)? 3  Help needed standing up from a chair using your arms (e.g., wheelchair or bedside chair)? 4  Help needed to walk in hospital room? 3  Help needed  climbing 3-5 steps with a railing?  2  6 Click Score 19  Consider Recommendation of Discharge To: Home with [facility]  PT Goal Progression  Progress towards PT goals Progressing toward goals  Acute Rehab PT Goals  PT Goal Formulation With patient  Time For Goal Achievement 12/04/19  Potential to Achieve Goals Good  PT Time Calculation  PT Start Time (ACUTE ONLY) 1228  PT Stop Time (ACUTE ONLY) 1304  PT Time Calculation (min) (ACUTE ONLY) 36 min  PT General Charges  $$ ACUTE PT VISIT 1 Visit  PT Treatments  $Therapeutic Activity 23-37 mins  Anastasio Champion, DPT  Acute Rehabilitation Services Pager [phone] Office [phone]

## 2019-11-20 NOTE — TOC Initial Note (Signed)
Transition of Care [facility]) - Initial/Assessment Note    Patient Details  Name: Lisa Espinoza MRN: [id number] Date of Birth: 1996-01-06  Transition of Care [facility]) CM/SW Contact:    Bess Kinds, RN Phone Number: [phone] 11/20/2019, 11:53 AM  Clinical Narrative:                 Spoke with patient and mom at the bedside. Discussed HH PT recommendations - referral placed to [facility]. Discussed DME for RW and 3-N-1 - referral sent to [employer] to deliver to bedside. Patient and mom asking about ambulance transport d/t patient unable to get up 2 flights of stairs. Will arrange when patient is ready. TOC team following.          Patient Goals and CMS Choice        Expected Discharge Plan and Services           Expected Discharge Date: 11/20/19                                    Prior Living Arrangements/Services                       Activities of Daily Living Home Assistive Devices/Equipment: None ADL Screening (condition at time of admission) Patient's cognitive ability adequate to safely complete daily activities?: Yes Is the patient deaf or have difficulty hearing?: No Does the patient have difficulty seeing, even when wearing glasses/contacts?: No Does the patient have difficulty concentrating, remembering, or making decisions?: No Patient able to express need for assistance with ADLs?: Yes Does the patient have difficulty dressing or bathing?: No Independently performs ADLs?: Yes (appropriate for developmental age) Does the patient have difficulty walking or climbing stairs?: Yes Weakness of Legs: Left Weakness of Arms/Hands: None  Permission Sought/Granted                  Emotional Assessment              Admission diagnosis:  Ankle fracture, bimalleolar, closed, left, initial encounter [X54.008Q] Patient Active Problem List   Diagnosis Date Noted  . Bimalleolar ankle fracture, left, closed, initial encounter 11/19/2019   . Ankle fracture, bimalleolar, closed, left, initial encounter 11/19/2019   PCP:  Patient, No Pcp Per Pharmacy:   [facility] [address] [place] - [address] [place] [address] Phone: [phone] Fax: [phone]  [id number] - [place] [facility] - [address] [place] [address] Phone: [phone] Fax: [phone]  [id number] - [place], [place] - [address] AT [facility] [address] [place] [address] Phone: [phone] Fax: [phone]     Social Determinants of Health (SDOH) Interventions    Readmission Risk Interventions No flowsheet data found.

## 2019-11-20 NOTE — TOC Progression Note (Signed)
Transition of Care [facility]) - Progression Note    Patient Details  Name: Lisa Espinoza MRN: [id number] Date of Birth: Mar 11, 1996  [facility]) CM/SW Contact  Bess Kinds, RN Phone Number: [phone] 11/20/2019, 1:13 PM  Clinical Narrative:    Notified by [facility] that patient has been accepted for [facility] PT. Notified by bedside RN that patient will stay an extra night. Will arrange PTAR transport when ready to transition home. TOC team following.         Expected Discharge Plan and Services           Expected Discharge Date: 11/20/19                                     Social Determinants of Health (SDOH) Interventions    Readmission Risk Interventions No flowsheet data found.

## 2019-11-21 ENCOUNTER — Other Ambulatory Visit: Payer: Self-pay | Admitting: Physician Assistant

## 2019-11-21 ENCOUNTER — Encounter: Payer: Self-pay | Admitting: *Deleted

## 2019-11-21 NOTE — TOC Transition Note (Signed)
Transition of Care [facility]) - CM/SW Discharge Note   Patient Details  Name: Lisa Espinoza MRN: [id number] Date of Birth: 1996/06/26  Transition of Care [facility]) CM/SW Contact:  Bess Kinds, RN Phone Number: [phone] 11/21/2019, 2:59 PM   Clinical Narrative:    Wheelchair delivered to the room. PTAR arranged for transport. No further TOC needs identified.    Final next level of care: Home w Home Health Services Barriers to Discharge: No Barriers Identified   Patient Goals and CMS Choice Patient states their goals for this hospitalization and ongoing recovery are:: home CMS Medicare.gov Compare Post Acute Care list provided to:: Patient Choice offered to / list presented to : Patient, Parent  Discharge Placement                       Discharge Plan and Services                DME Arranged: 3-N-1, Walker rolling, Wheelchair manual DME Agency: [employer] Date DME Agency Contacted: 11/21/19 Time DME Agency Contacted: 1046 Representative spoke with at DME Agency: Ian Malkin HH Arranged: PT HH Agency: [facility] Date HH Agency Contacted: 11/21/19 Time HH Agency Contacted: 1046 Representative spoke with at [facility] Agency: Tiffany  Social Determinants of Health (SDOH) Interventions     Readmission Risk Interventions No flowsheet data found.

## 2019-11-21 NOTE — Progress Notes (Signed)
Discharge packet and information given to pt. Pt not in distress and tolerated well. Wheelchair delivered to pt's room. PTAR will be d/c pt.

## 2019-11-21 NOTE — Progress Notes (Cosign Needed)
    Durable Medical Equipment  (From admission, onward)         Start     Ordered   11/21/19 1101  For home use only DME lightweight manual wheelchair with seat cushion  Once    Comments: Patient suffers from a broken left ankle which impairs their ability to perform daily activities like toileting in the home.  A walker will not resolve  issue with performing activities of daily living. A wheelchair will allow patient to safely perform daily activities. Patient is not able to propel themselves in the home using a standard weight wheelchair due to weakness and broken ankle. Patient can self propel in the lightweight wheelchair. Length of need is 6 months and patient spends at least two hours per day in their chair. Accessories: elevating leg rests (ELRs), wheel locks, extensions and anti-tippers.   11/21/19 1104   11/20/19 1255  For home use only DME high strength lightweight manual wheelchair with seat cushion  Once    Comments: Patient suffers from a broken left ankle which impairs their ability to perform daily activities like toileting in the home.  A walker will not resolve  issue with performing activities of daily living. A wheelchair will allow patient to safely perform daily activities.Length of need 6 months . (THEN ONE OF THESE TWO:) Patient requires a size of larger which is not available in a standard or lightweight wheelchair and patient spends at least two hours per day in their chair. Accessories: elevating leg rests (ELRs), wheel locks, extensions and anti-tippers.   11/20/19 1255   11/19/19 1731  DME 3 n 1  Once     11/19/19 1730   11/19/19 1731  DME Walker rolling  Once    Question Answer Comment  Walker: With 5 Inch Wheels   Patient needs a walker to treat with the following condition Ankle fracture, bimalleolar, closed, left, initial encounter      11/19/19 1730          Hortencia Conradi, RN MSN CCM Transitions of Care [phone]

## 2019-11-21 NOTE — Plan of Care (Signed)
  Problem: Education: Goal: Knowledge of General Education information will improve Description: Including pain rating scale, medication(s)/side effects and non-pharmacologic comfort measures Outcome: Progressing   Problem: Pain Managment: Goal: General experience of comfort will improve Outcome: Progressing   

## 2019-11-21 NOTE — Progress Notes (Signed)
Subjective: 2 Days Post-Op Procedure(s) (LRB): OPEN REDUCTION INTERNAL FIXATION (ORIF) LEFT ANKLE FRACTURE, STABILIZATION OF SYNDESMOSIS (Left) Patient reports pain as moderate.  States pain is improved from yesterday. No complaints otherwise.   Objective: Vital signs in last 24 hours: Temp:  [97.6 F (36.4 C)-98.2 F (36.8 C)] 98.2 F (36.8 C) (02/11 0438) Pulse Rate:  [69-87] 81 (02/11 0438) Resp:  [16-18] 16 (02/11 0438) BP: (120-132)/(74-95) 120/74 (02/11 0438) SpO2:  [100 %] 100 % (02/11 0438)  Intake/Output from previous day: 02/10 0701 - 02/11 0700 In: 720 [P.O.:720] Out: -  Intake/Output this shift: No intake/output data recorded.  Recent Labs    11/19/19 1225  HGB 14.6   Recent Labs    11/19/19 1225  WBC 11.6*  RBC 4.41  HCT 42.9  PLT 380   Recent Labs    11/19/19 1225  NA 139  K 4.2  CL 107  CO2 17*  BUN 13  CREATININE 1.01*  GLUCOSE 82  CALCIUM 9.1   No results for input(s): LABPT, INR in the last 72 hours.  Incision: dressing C/D/I Compartment soft Subjective decreased sensation over lateral aspect of superficial peroneal nerve. Able to wiggle toes.   Assessment/Plan: 2 Days Post-Op Procedure(s) (LRB): OPEN REDUCTION INTERNAL FIXATION (ORIF) LEFT ANKLE FRACTURE, STABILIZATION OF SYNDESMOSIS (Left) Up with therapy  Discharge to home.       Lisa Espinoza 11/21/2019, 7:35 AM

## 2019-11-21 NOTE — Discharge Summary (Signed)
Patient ID: Lisa Espinoza MRN: [id number] DOB/AGE: 11/16/1995 23 y.o.  Admit date: 11/19/2019 Discharge date: 11/21/2019  Admission Diagnoses:  Principal Problem:   Bimalleolar ankle fracture, left, closed, initial encounter Active Problems:   Ankle fracture, bimalleolar, closed, left, initial encounter   Discharge Diagnoses:  Same  Past Medical History:  Diagnosis Date  . Anxiety   . Asthma   . Depression   . Fracture of left ankle    bimalleolar  . Hypertension   . Pneumonia    as a child  . Wears glasses     Surgeries: Procedure(s): OPEN REDUCTION INTERNAL FIXATION (ORIF) LEFT ANKLE FRACTURE, STABILIZATION OF SYNDESMOSIS on 11/19/2019   Consultants:   Discharged Condition: Improved  Hospital Course: Lisa Espinoza is an 24 y.o. female who was admitted 11/19/2019 for operative treatment ofBimalleolar ankle fracture, left, closed, initial encounter. Patient has severe unremitting pain that affects sleep, daily activities, and work/hobbies. After pre-op clearance the patient was taken to the operating room on 11/19/2019 and underwent  Procedure(s): OPEN REDUCTION INTERNAL FIXATION (ORIF) LEFT ANKLE FRACTURE, STABILIZATION OF SYNDESMOSIS.    Patient was given perioperative antibiotics:  Anti-infectives (From admission, onward)   Start     Dose/Rate Route Frequency Ordered Stop   11/19/19 1745  ceFAZolin (ANCEF) IVPB 1 g/50 mL premix     1 g 100 mL/hr over 30 Minutes Intravenous Every 6 hours 11/19/19 1730 11/20/19 0637   11/19/19 1415  ceFAZolin (ANCEF) IVPB 2g/100 mL premix     2 g 200 mL/hr over 30 Minutes Intravenous On call to O.R. 11/19/19 1155 11/19/19 1410   11/19/19 1202  ceFAZolin (ANCEF) 2-4 GM/100ML-% IVPB    Note to Pharmacy: Dia Crawford   : cabinet override      11/19/19 1202 11/19/19 1422       Patient was given sequential compression devices, early ambulation, and chemoprophylaxis to prevent DVT.  Patient benefited maximally from hospital stay and  there were no complications.    Recent vital signs:  Patient Vitals for the past 24 hrs:  BP Temp Temp src Pulse Resp SpO2  11/21/19 0438 120/74 98.2 F (36.8 C) Oral 81 16 100 %  11/21/19 0347 121/78 97.9 F (36.6 C) Oral 77 18 100 %  11/20/19 2204 128/76 98.2 F (36.8 C) Oral 69 17 100 %  11/20/19 1659 124/90 97.9 F (36.6 C) Oral 87 18 100 %  11/20/19 0801 (!) 132/95 97.6 F (36.4 C) Oral 80 18 100 %     Recent laboratory studies:  Recent Labs    11/19/19 1225  WBC 11.6*  HGB 14.6  HCT 42.9  PLT 380  NA 139  K 4.2  CL 107  CO2 17*  BUN 13  CREATININE 1.01*  GLUCOSE 82  CALCIUM 9.1     Discharge Medications:   Allergies as of 11/21/2019   No Known Allergies     Medication List    STOP taking these medications   ibuprofen 800 MG tablet Commonly known as: ADVIL     TAKE these medications   acetaminophen 325 MG tablet Commonly known as: TYLENOL Take 650 mg by mouth every 6 (six) hours as needed for mild pain or headache.   cetirizine 10 MG tablet Commonly known as: ZYRTEC Take 10 mg by mouth daily.   Junel 1.5/30 1.5-30 MG-MCG tablet Generic drug: Norethindrone Acetate-Ethinyl Estradiol Take 1 tablet by mouth daily.   oxyCODONE 5 MG immediate release tablet Commonly known as: Roxicodone Take 1-2 tablets (5-10 mg  total) by mouth every 6 (six) hours as needed for severe pain.            Durable Medical Equipment  (From admission, onward)         Start     Ordered   11/20/19 1255  For home use only DME high strength lightweight manual wheelchair with seat cushion  Once    Comments: Patient suffers from a broken left ankle which impairs their ability to perform daily activities like toileting in the home.  A walker will not resolve  issue with performing activities of daily living. A wheelchair will allow patient to safely perform daily activities.Length of need 6 months . (THEN ONE OF THESE TWO:) Patient requires a size of larger which is not  available in a standard or lightweight wheelchair and patient spends at least two hours per day in their chair. Accessories: elevating leg rests (ELRs), wheel locks, extensions and anti-tippers.   11/20/19 1255   11/19/19 1731  DME 3 n 1  Once     11/19/19 1730   11/19/19 1731  DME Walker rolling  Once    Question Answer Comment  Walker: With 5 Inch Wheels   Patient needs a walker to treat with the following condition Ankle fracture, bimalleolar, closed, left, initial encounter      11/19/19 1730          Diagnostic Studies: DG Ankle 2 Views Left  Result Date: 11/12/2019 CLINICAL DATA:  Status post reduction of the left ankle. EXAM: LEFT ANKLE - 2 VIEW COMPARISON:  November 12, 2019 FINDINGS: Acute fracture deformities are seen involving the left medial malleolus and distal shaft of the left fibula. Gross anatomic alignment is seen. There is been interval reduction of the previously noted left ankle dislocation. Mild diffuse soft tissue swelling is seen. IMPRESSION: 1. Acute fractures of the left medial malleolus and distal left fibula with interval reduction of the previously noted left ankle dislocation seen on the prior study dated November 12, 2019 (acquired at 5:34 p.m. Electronically Signed   By: Virgina Norfolk M.D.   On: 11/12/2019 18:54   DG Ankle Complete Left  Result Date: 11/19/2019 CLINICAL DATA:  Left ankle fracture EXAM: LEFT ANKLE COMPLETE - 3+ VIEW; DG C-ARM 1-60 MIN COMPARISON:  11/12/2019 FINDINGS: Four fluoroscopic images are obtained during the performance of the procedure and are provided for interpretation only. Images demonstrate plate and screw fixation across the distal fibular fracture. Two screws traverse the tibiofibular syndesmosis. There are 2 cannulated screws traversing the medial malleolar fracture. Alignment is near anatomic. FLUOROSCOPY TIME:  17 seconds IMPRESSION: 1. ORIF left ankle fractures as above. Electronically Signed   By: Randa Ngo M.D.   On:  11/19/2019 16:17   DG Ankle Complete Left  Result Date: 11/12/2019 CLINICAL DATA:  Status post trauma. EXAM: LEFT ANKLE COMPLETE - 3+ VIEW COMPARISON:  None. FINDINGS: A displaced fracture deformity is seen involving the left medial malleolus. An additional displaced fracture of the distal shaft of the left fibula is noted with anteromedial angulation of the fracture apex. There is disruption of the left ankle mortise with posterolateral dislocation of the left ankle. Moderate severity diffuse soft tissue swelling is noted. IMPRESSION: 1. Displaced fractures of the left medial malleolus and distal shaft of the left fibula. 2. Disruption of the left ankle mortise with posterolateral dislocation of the left ankle. Electronically Signed   By: Virgina Norfolk M.D.   On: 11/12/2019 18:34   DG C-Arm  1-60 Min  Result Date: 11/19/2019 CLINICAL DATA:  Left ankle fracture EXAM: LEFT ANKLE COMPLETE - 3+ VIEW; DG C-ARM 1-60 MIN COMPARISON:  11/12/2019 FINDINGS: Four fluoroscopic images are obtained during the performance of the procedure and are provided for interpretation only. Images demonstrate plate and screw fixation across the distal fibular fracture. Two screws traverse the tibiofibular syndesmosis. There are 2 cannulated screws traversing the medial malleolar fracture. Alignment is near anatomic. FLUOROSCOPY TIME:  17 seconds IMPRESSION: 1. ORIF left ankle fractures as above. Electronically Signed   By: Sharlet Salina M.D.   On: 11/19/2019 16:17    Disposition: Discharge disposition: 01-Home or Self Care         Follow-up Information    Kathryne Hitch, MD. Schedule an appointment as soon as possible for a visit in 2 week(s).   Specialty: Orthopedic Surgery Contact information: [address] [place] [address] [phone]        [facility] Follow up.   Specialty: Home Health Services Why: The office will call to schedule visits for PT Contact information: [address] [place] [address] [phone]        [facility] Follow up.   Why: provided wheelchair and bedside commode           Signed: Richardean Canal 11/21/2019, 7:40 AM

## 2019-11-21 NOTE — Progress Notes (Signed)
Physical Therapy Treatment Patient Details Name: Lisa Espinoza MRN: [id number] DOB: 11/16/1995 Today's Date: 11/21/2019    History of Present Illness Pt is a 24 y/o female s/p ORIF L ankle secondary to a mechanical fall while moving furniture. PMH including but not limited to asthma, anxiety and HTN.    PT Comments    Pt making steady progress with functional mobility. Focus of session was on HEP and further gait training. Pt able to better tolerate therapeutic interventions this session as compared to yesterday. Plan is for pt to d/c home today with ambulance transport and family support. Pt would continue to benefit from skilled physical therapy services at this time while admitted and after d/c to address the below listed limitations in order to improve overall safety and independence with functional mobility.    Follow Up Recommendations  Home health PT;Supervision/Assistance - 24 hour     Equipment Recommendations  Rolling walker with 5" wheels;3in1 (PT);Wheelchair (measurements PT);Wheelchair cushion (measurements PT);Other (comment)(w/c with elevating leg rests)    Recommendations for Other Services       Precautions / Restrictions Precautions Precautions: Fall Restrictions Weight Bearing Restrictions: Yes LLE Weight Bearing: Non weight bearing    Mobility  Bed Mobility Overal bed mobility: Needs Assistance Bed Mobility: Supine to Sit;Sit to Supine     Supine to sit: Supervision Sit to supine: Supervision      Transfers Overall transfer level: Needs assistance Equipment used: Rolling walker (2 wheeled) Transfers: Sit to/from Stand Sit to Stand: Min guard         General transfer comment: good technique utilized, min guard for safety  Ambulation/Gait Ambulation/Gait assistance: Counsellor (Feet): 12 Feet Assistive device: Rolling walker (2 wheeled) Gait Pattern/deviations: (hop-to on R LE) Gait velocity: decreased   General Gait  Details: pt able to hop a short distance on R LE, overall steady with RW, cautious and slow   Stairs             Wheelchair Mobility    Modified Rankin (Stroke Patients Only)       Balance Overall balance assessment: Needs assistance Sitting-balance support: No upper extremity supported Sitting balance-Leahy Scale: Good     Standing balance support: Bilateral upper extremity supported Standing balance-Leahy Scale: Poor                              Cognition Arousal/Alertness: Awake/alert Behavior During Therapy: WFL for tasks assessed/performed Overall Cognitive Status: Within Functional Limits for tasks assessed                                        Exercises General Exercises - Lower Extremity Quad Sets: AROM;Strengthening;Left;10 reps;Supine Gluteal Sets: AROM;Strengthening;Both;10 reps;Supine Short Arc Quad: AROM;Strengthening;Left;10 reps;Supine Heel Slides: AROM;Strengthening;Left;10 reps;Supine Hip ABduction/ADduction: AROM;Strengthening;Left;10 reps;Supine Straight Leg Raises: AROM;Strengthening;Left;5 reps;Supine    General Comments        Pertinent Vitals/Pain Pain Assessment: 0-10 Pain Score: 7  Pain Location: L ankle Pain Descriptors / Indicators: Aching Pain Intervention(s): Repositioned;Monitored during session;Patient requesting pain meds-RN notified    Home Living                      Prior Function            PT Goals (current goals can now be found in the care plan section) Acute  Rehab PT Goals PT Goal Formulation: With patient Time For Goal Achievement: 12/04/19 Potential to Achieve Goals: Good Progress towards PT goals: Progressing toward goals    Frequency    Min 5X/week      PT Plan Current plan remains appropriate    Co-evaluation              AM-PAC PT "6 Clicks" Mobility   Outcome Measure  Help needed turning from your back to your side while in a flat bed without  using bedrails?: None Help needed moving from lying on your back to sitting on the side of a flat bed without using bedrails?: None Help needed moving to and from a bed to a chair (including a wheelchair)?: A Little Help needed standing up from a chair using your arms (e.g., wheelchair or bedside chair)?: A Little Help needed to walk in hospital room?: A Little Help needed climbing 3-5 steps with a railing? : A Lot 6 Click Score: 19    End of Session Equipment Utilized During Treatment: Gait belt Activity Tolerance: Patient limited by pain Patient left: in bed;with call bell/phone within reach Nurse Communication: Mobility status PT Visit Diagnosis: Other abnormalities of gait and mobility (R26.89);Pain Pain - Right/Left: Left Pain - part of body: Ankle and joints of foot     Time: 8841-6606 PT Time Calculation (min) (ACUTE ONLY): 26 min  Charges:  $Therapeutic Exercise: 8-22 mins $Therapeutic Activity: 8-22 mins                     Arletta Bale, DPT  Acute Rehabilitation Services Pager [phone] Office [phone]     Lisa Espinoza Lisa Espinoza 11/21/2019, 10:38 AM

## 2019-11-21 NOTE — Plan of Care (Signed)
  Problem: Skin Integrity: Goal: Risk for impaired skin integrity will decrease Outcome: Progressing   Problem: Safety: Goal: Ability to remain free from injury will improve Outcome: Progressing   Problem: Pain Managment: Goal: General experience of comfort will improve Outcome: Progressing   Problem: Elimination: Goal: Will not experience complications related to bowel motility Outcome: Progressing   Problem: Activity: Goal: Risk for activity intolerance will decrease Outcome: Progressing   Problem: Activity: Goal: Risk for activity intolerance will decrease Outcome: Progressing   Problem: Education: Goal: Knowledge of General Education information will improve Description: Including pain rating scale, medication(s)/side effects and non-pharmacologic comfort measures Outcome: Progressing

## 2019-11-21 NOTE — Anesthesia Postprocedure Evaluation (Signed)
Anesthesia Post Note  Patient: Lisa Espinoza  Procedure(s) Performed: OPEN REDUCTION INTERNAL FIXATION (ORIF) LEFT ANKLE FRACTURE, STABILIZATION OF SYNDESMOSIS (Left Ankle)     Patient location during evaluation: PACU Anesthesia Type: General and Regional Level of consciousness: awake and alert Pain management: pain level controlled Vital Signs Assessment: post-procedure vital signs reviewed and stable Respiratory status: spontaneous breathing, nonlabored ventilation, respiratory function stable and patient connected to nasal cannula oxygen Cardiovascular status: blood pressure returned to baseline and stable Postop Assessment: no apparent nausea or vomiting Anesthetic complications: no    Last Vitals:  Vitals:   11/21/19 0347 11/21/19 0438  BP: 121/78 120/74  Pulse: 77 81  Resp: 18 16  Temp: 36.6 C 36.8 C  SpO2: 100% 100%    Last Pain:  Vitals:   11/21/19 0616  TempSrc:   PainSc: 4                  Alicya Bena L Jordyne Poehlman

## 2019-11-21 NOTE — TOC Transition Note (Signed)
Transition of Care [facility]) - CM/SW Discharge Note   Patient Details  Name: Lisa Espinoza MRN: [id number] Date of Birth: 1996-04-20  [facility] CM/SW Contact:  Bess Kinds, RN Phone Number: [phone] 11/21/2019, 10:47 AM   Clinical Narrative:    Patient to transition home today. DME - 3-N-1 and RW delivered to room yesterday for mom to take home. Referral for wheelchair sent to [facility] to deliver to room for mom to take home. Patient to transport home via [facility] d/t unable to get up 2 flights of steps with broken ankle. No further TOC needs identified at this time.   Final next level of care: Home w Home Health Services Barriers to Discharge: No Barriers Identified   Patient Goals and CMS Choice Patient states their goals for this hospitalization and ongoing recovery are:: home CMS Medicare.gov Compare Post Acute Care list provided to:: Patient Choice offered to / list presented to : Patient, Parent  Discharge Placement                       Discharge Plan and Services                DME Arranged: 3-N-1, Walker rolling, Wheelchair manual DME Agency: [facility] Date DME Agency Contacted: 11/21/19 Time DME Agency Contacted: 1046 Representative spoke with at DME Agency: Ian Malkin HH Arranged: PT HH Agency: [facility] Date HH Agency Contacted: 11/21/19 Time HH Agency Contacted: 1046 Representative spoke with at [facility] Agency: Tiffany  Social Determinants of Health (SDOH) Interventions     Readmission Risk Interventions No flowsheet data found.

## 2019-11-22 ENCOUNTER — Telehealth: Payer: Self-pay | Admitting: Orthopaedic Surgery

## 2019-11-22 NOTE — Telephone Encounter (Signed)
[facility] with [facility] called in requesting verbal orders for pt for 2 times a week for 2 weeks and 1 time a week for 2 weeks.   [phone]

## 2019-11-25 NOTE — Telephone Encounter (Signed)
Verbal order given  

## 2019-11-29 ENCOUNTER — Telehealth: Payer: Self-pay | Admitting: Orthopaedic Surgery

## 2019-11-29 MED ORDER — OXYCODONE HCL 5 MG PO TABS: 5.0000 mg | ORAL_TABLET | Freq: Four times a day (QID) | ORAL | 0 refills | Status: AC | PRN

## 2019-11-29 NOTE — Telephone Encounter (Signed)
Please advise 

## 2019-11-29 NOTE — Telephone Encounter (Signed)
I sent in some more 

## 2019-11-29 NOTE — Telephone Encounter (Signed)
Patient called needing Rx refilled (Oxycodone) The number to contact patient is [phone]

## 2019-12-03 ENCOUNTER — Inpatient Hospital Stay: Payer: BC Managed Care – PPO | Admitting: Orthopaedic Surgery

## 2019-12-04 ENCOUNTER — Other Ambulatory Visit: Payer: Self-pay

## 2019-12-04 ENCOUNTER — Ambulatory Visit (INDEPENDENT_AMBULATORY_CARE_PROVIDER_SITE_OTHER): Payer: BC Managed Care – PPO | Admitting: Orthopaedic Surgery

## 2019-12-04 ENCOUNTER — Ambulatory Visit (INDEPENDENT_AMBULATORY_CARE_PROVIDER_SITE_OTHER): Payer: BC Managed Care – PPO

## 2019-12-04 ENCOUNTER — Encounter: Payer: Self-pay | Admitting: Orthopaedic Surgery

## 2019-12-04 VITALS — Ht 66.0 in | Wt 270.0 lb

## 2019-12-04 DIAGNOSIS — M25572 Pain in left ankle and joints of left foot: Secondary | ICD-10-CM

## 2019-12-04 DIAGNOSIS — S82842A Displaced bimalleolar fracture of left lower leg, initial encounter for closed fracture: Secondary | ICD-10-CM

## 2019-12-04 NOTE — Progress Notes (Signed)
The patient is a very pleasant 24 year old who is 2 weeks postop from a severe left ankle fracture dislocation with a disruption of the syndesmosis. We were able to plate the lateral malleolus and placed screws in the medial malleolus. She also has 2 syndesmosis screws. She has been compliant with nonweightbearing. She denies any calf pain. She denies any significant numbness and tingling in her left foot.  On exam this sutures have been removed medially and laterally and Steri-Strips applied. Her foot is well-perfused. She does demonstrate some slight flexion extension of the ankle.  3 views of the left ankle are obtained and show the ankle mortise is intact and the fracture is reduced anatomically. The syndesmosis fixation is intact. The more distal important screw is appropriately posterior lateral to anterior medial.  At this point we will put her in a cam walking boot and have her work on gentle range of motion of her ankle. In 2 weeks from now she can attempt full weightbearing in the boot. I would like to see her back in 4 weeks with a repeat 3 views of her left ankle. We may consider removing her syndesmosis screws at about 8 weeks post fixation. All questions and concerns were answered and addressed. We had her mother on the phone to talk with her as well.

## 2019-12-13 ENCOUNTER — Telehealth: Payer: Self-pay | Admitting: Orthopaedic Surgery

## 2019-12-13 MED ORDER — HYDROCODONE-ACETAMINOPHEN 5-325 MG PO TABS: 1.0000 | ORAL_TABLET | Freq: Four times a day (QID) | ORAL | 0 refills | Status: AC | PRN

## 2019-12-13 MED ORDER — TIZANIDINE HCL 4 MG PO TABS: 4.0000 mg | ORAL_TABLET | Freq: Two times a day (BID) | ORAL | 0 refills | Status: AC | PRN

## 2019-12-13 NOTE — Telephone Encounter (Signed)
Patient aware of the below message  

## 2019-12-13 NOTE — Telephone Encounter (Signed)
Foot and calf are spasming, she is doing exercises to help but they are not really doing much to decease the number of times she is having the spasms.  She states that she is now in a boot for a little over a week now.  Not sure if it is because she has been able to try to work it more or why this is happening.

## 2019-12-13 NOTE — Telephone Encounter (Signed)
Please advise 

## 2019-12-13 NOTE — Telephone Encounter (Signed)
I sent in some muscle relaxer and some more hydrocodone.  Just let her know that there is nothing she is doing wrong and that some patients experience spasms and some don't.

## 2019-12-13 NOTE — Telephone Encounter (Signed)
Patient called requesting a refill of hydrocodone. Please send to pharmacy on file. Patient phone number is [phone]

## 2020-01-02 ENCOUNTER — Encounter: Payer: Self-pay | Admitting: Orthopaedic Surgery

## 2020-01-02 ENCOUNTER — Ambulatory Visit (INDEPENDENT_AMBULATORY_CARE_PROVIDER_SITE_OTHER): Payer: BC Managed Care – PPO | Admitting: Orthopaedic Surgery

## 2020-01-02 ENCOUNTER — Other Ambulatory Visit: Payer: Self-pay

## 2020-01-02 ENCOUNTER — Ambulatory Visit (INDEPENDENT_AMBULATORY_CARE_PROVIDER_SITE_OTHER): Payer: BC Managed Care – PPO

## 2020-01-02 DIAGNOSIS — S82842A Displaced bimalleolar fracture of left lower leg, initial encounter for closed fracture: Secondary | ICD-10-CM

## 2020-01-02 DIAGNOSIS — S82852D Displaced trimalleolar fracture of left lower leg, subsequent encounter for closed fracture with routine healing: Secondary | ICD-10-CM

## 2020-01-02 NOTE — Progress Notes (Signed)
Lisa Espinoza is now 6 weeks status post fixation of a trimalleolar ankle fracture dislocation with syndesmosis disruption.  We have her in a cam walking boot and she has begun weightbearing as tolerated.  She is at home therapy as well.  On exam out of the boot her swelling has gone down significantly.  Her incisions medially and laterally look great.  Her ankle is clinically well located and feels stable on exam.  She actually is able to flex and extend the ankle.  3 views of the left ankle were obtained.  The hardware is intact and the ankle mortise is congruent.  At this point we will transition her to an ASO when she is comfortable.  She can continue weightbearing as tolerated.  I explained to her that the syndesmosis screws need to stay in place and they may eventually break.  I would rather watch them further before considering taking them out versus just letting him stay in permanently.  I would like to see her back in 4 weeks for repeat 3 views of her left ankle.  All question concerns were answered and addressed.

## 2020-01-15 ENCOUNTER — Telehealth: Payer: Self-pay | Admitting: Orthopaedic Surgery

## 2020-01-15 NOTE — Telephone Encounter (Signed)
Patient aware her handicap is at the front desk

## 2020-01-15 NOTE — Telephone Encounter (Signed)
Patient called needing a Handicap Placard for her car. Patient said she can not walk to far. The number to contact patient is [phone]

## 2020-01-30 ENCOUNTER — Ambulatory Visit (INDEPENDENT_AMBULATORY_CARE_PROVIDER_SITE_OTHER): Payer: BC Managed Care – PPO

## 2020-01-30 ENCOUNTER — Other Ambulatory Visit: Payer: Self-pay

## 2020-01-30 ENCOUNTER — Encounter: Payer: Self-pay | Admitting: Orthopaedic Surgery

## 2020-01-30 ENCOUNTER — Ambulatory Visit (INDEPENDENT_AMBULATORY_CARE_PROVIDER_SITE_OTHER): Payer: BC Managed Care – PPO | Admitting: Orthopaedic Surgery

## 2020-01-30 DIAGNOSIS — S82852D Displaced trimalleolar fracture of left lower leg, subsequent encounter for closed fracture with routine healing: Secondary | ICD-10-CM

## 2020-01-30 NOTE — Progress Notes (Signed)
Office Visit Note   Patient: Lisa Espinoza           Date of Birth: [birth year]           MRN: [id number] Visit Date: 01/30/2020              Requested by: No referring provider defined for this encounter. PCP: Patient, No Pcp Per   Assessment & Plan: Visit Diagnoses:  1. Closed trimalleolar fracture of left ankle with routine healing, subsequent encounter     Plan: We will send her to physical therapy to work on range of motion of the left ankle.  They will include a home exercise program, modalities, wean her out of the ASO his proprioception and strength improved.  See her back in 1 month check her progress.  She will work on scar tissue mobilization.  No radiographs at that time unless clinically indicated.  Follow-Up Instructions: Return in about 4 weeks (around 02/27/2020).   Orders:  Orders Placed This Encounter  Procedures  . XR Ankle Complete Left   No orders of the defined types were placed in this encounter.     Procedures: No procedures performed   Clinical Data: No additional findings.   Subjective: Chief Complaint  Patient presents with  . Left Ankle - Follow-up    HPI Lisa Espinoza returns now 10 weeks status post open reduction internal fixation left trimalleolar ankle fracture.  She is overall doing well she is wearing the ASO brace.  She does feel the ankle is weak.  She wears the ASO brace at all times.  She is having no significant pain at this point time. Review of Systems   Objective: Vital Signs: There were no vitals taken for this visit.  Physical Exam Constitutional:      Appearance: She is not ill-appearing or diaphoretic.  Neurological:     Mental Status: She is alert.     Ortho Exam Left ankle good dorsiflexion plantarflexion without significant pain.  Surgical incisions well-healed.  She is able to invert and evert foot.  Subjective decreased sensation over the lateral aspect of the left foot.  Specialty Comments:  No specialty  comments available.  Imaging: XR Ankle Complete Left  Result Date: 01/30/2020 Left ankle 3 views: Left ankle talus well located within the ankle mortise without diastases.  No hardware failure.  Medial malleolus fracture is well-healed.  The fibular fracture slightly evident but otherwise well-healed.  No other acute fractures.    PMFS History: Patient Active Problem List   Diagnosis Date Noted  . Bimalleolar ankle fracture, left, closed, initial encounter 11/19/2019  . Ankle fracture, bimalleolar, closed, left, initial encounter 11/19/2019   Past Medical History:  Diagnosis Date  . Anxiety   . Asthma   . Depression   . Fracture of left ankle    bimalleolar  . Hypertension   . Pneumonia    as a child  . Wears glasses     Family History  Family history unknown: Yes    Past Surgical History:  Procedure Laterality Date  . ORIF ANKLE FRACTURE Left 11/19/2019   Procedure: OPEN REDUCTION INTERNAL FIXATION (ORIF) LEFT ANKLE FRACTURE, STABILIZATION OF SYNDESMOSIS;  Surgeon: Kathryne Hitch, MD;  Location: [facility] OR;  Service: Orthopedics;  Laterality: Left;   Social History   Occupational History  . Not on file  Tobacco Use  . Smoking status: Former Smoker    Types: Cigarettes    Quit date: 09/18/2019    Years since quitting:  0.3  . Smokeless tobacco: Never Used  Substance and Sexual Activity  . Alcohol use: Not Currently  . Drug use: Not Currently  . Sexual activity: Yes    Birth control/protection: Pill

## 2020-02-27 ENCOUNTER — Ambulatory Visit (INDEPENDENT_AMBULATORY_CARE_PROVIDER_SITE_OTHER): Payer: BC Managed Care – PPO | Admitting: Orthopaedic Surgery

## 2020-02-27 ENCOUNTER — Encounter: Payer: Self-pay | Admitting: Orthopaedic Surgery

## 2020-02-27 ENCOUNTER — Other Ambulatory Visit: Payer: Self-pay

## 2020-02-27 DIAGNOSIS — S82852D Displaced trimalleolar fracture of left lower leg, subsequent encounter for closed fracture with routine healing: Secondary | ICD-10-CM

## 2020-02-27 NOTE — Progress Notes (Signed)
The patient is between 13 and 14 weeks status post open reduction/internal fixation of a complex trimalleolar ankle fracture dislocation of the left ankle.  We had the placed syndesmosis screws as well.  She is 24 years old.  She has been going to physical therapy and reports significant improvement in her motion and strength of her ankle.  On examination of her left ankle she does have much improved motion of the ankle.  Her incisions of healed nicely.  The ankle feels ligamentously stable.  At this point she will continue increase her activities as comfort allows.  I would like to see her back in 6 months with a repeat 3 views of her left ankle.  If there is any problems before then she knows to let us know.  She understands the syndesmosis screws may break and she will feel that she may experience swelling.  If she is concerned about anything at all she knows that she should come back and see [place] earlier.  All questions and concerns were answered and addressed.

## 2020-05-19 ENCOUNTER — Telehealth: Payer: Self-pay | Admitting: Orthopaedic Surgery

## 2020-05-19 NOTE — Telephone Encounter (Signed)
Pt called stating her Handicap placard is about to expire and she would like to get it renewed due to not being able to walk the distance from schools parking deck to the classes.  [phone]

## 2020-05-19 NOTE — Telephone Encounter (Signed)
That will be fine.  I can sign it tomorrow.

## 2020-05-19 NOTE — Telephone Encounter (Signed)
Patient aware this is at the front desk for her

## 2020-06-04 ENCOUNTER — Telehealth: Payer: Self-pay

## 2020-06-04 ENCOUNTER — Telehealth: Payer: Self-pay | Admitting: Orthopaedic Surgery

## 2020-06-04 NOTE — Telephone Encounter (Signed)
I do agree with her having a note stating that she has significant bilateral lower extremity orthopedic conditions that cause significant limitations for her.

## 2020-06-04 NOTE — Telephone Encounter (Signed)
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 Patient is unable to use elevator at school or use of handicap parking. Patient is having a hard time getting to class with a broken left leg. She is requesting note to submit to school. The school guidelines states she needs note for functional limitations so she may attend online classes verses in house classes. Please call patient about this matter at [phone].

## 2020-06-04 NOTE — Telephone Encounter (Signed)
Patient called in to check on status of letter , says she wold like to come get it today is possible

## 2020-06-04 NOTE — Telephone Encounter (Signed)
Patient aware note is ready at front desk

## 2020-06-08 ENCOUNTER — Telehealth: Payer: Self-pay

## 2020-06-08 ENCOUNTER — Telehealth: Payer: Self-pay | Admitting: Orthopaedic Surgery

## 2020-06-08 NOTE — Telephone Encounter (Signed)
Patient called. Her school needs a note stating how long she will need to do online classes. Her call back number is [phone]. [phone] is the fax number

## 2020-06-08 NOTE — Telephone Encounter (Signed)
Patient called  Patient needs duration about her recovery listed in her school note   Call [phone]

## 2020-06-08 NOTE — Telephone Encounter (Signed)
I assume that she only needs these accommodations for the first semester.

## 2020-06-08 NOTE — Telephone Encounter (Signed)
Note written  LMOM for patient that this was done

## 2020-06-10 NOTE — Telephone Encounter (Signed)
Faxed new note to [phone]

## 2020-08-31 ENCOUNTER — Ambulatory Visit (INDEPENDENT_AMBULATORY_CARE_PROVIDER_SITE_OTHER): Payer: BC Managed Care – PPO | Admitting: Orthopaedic Surgery

## 2020-08-31 ENCOUNTER — Encounter: Payer: Self-pay | Admitting: Orthopaedic Surgery

## 2020-08-31 ENCOUNTER — Ambulatory Visit: Payer: Self-pay

## 2020-08-31 DIAGNOSIS — S82852D Displaced trimalleolar fracture of left lower leg, subsequent encounter for closed fracture with routine healing: Secondary | ICD-10-CM

## 2020-08-31 NOTE — Progress Notes (Signed)
The patient is a 24 year old female who is now 8 months out from fixation of a left ankle trimalleolar fracture dislocation with syndesmosis disruption.  She has been weightbearing as tolerated and says the ankle is doing well with just only a little bit of stiffness and discomfort.  She feels like sometimes the knee gives way on her.  She has been having some significant low back pain.  She has had no other acute change in her medical status.  She walks without assistive device.  Both knees slightly hyperextend.  She is obese with a weight of 270 pounds.  She is a very pleasant individual and active.  Examination of her left operative ankle shows the incisions of healed nicely.  She actually has excellent range of motion of that ankle and is ligamentously stable on exam.  2 para 3 views left ankle are obtained and shared with the fractures of healed.  The ankle is well located.  The most inferior syndesmosis screw has broken.  I explained what this means to her.  This is not of any clinical significance now given the stability of her ankle.  She has been reporting some low back pain.  Both knees slightly hyperextend with the left side more so than the right.  There is some slight quad atrophy on the left thigh.  I do feel that she would benefit from outpatient physical therapy for strengthening her quads but also any modalities that can decrease her low back pain.  Certainly losing weight will help as well.  All question concerns were answered and addressed.  I gave her a prescription for physical therapy.  Follow-up can otherwise be as needed.

## 2020-11-19 IMAGING — RF DG C-ARM 1-60 MIN
1 series · 4 of 4 positions shown · non-contrast
Comparison: 11/12/2019

CLINICAL DATA: Left ankle fracture

EXAM:
LEFT ANKLE COMPLETE - 3+ VIEW; DG C-ARM 1-60 MIN

[Series 1: run · 4 of 4 slices shown]
[im 1/4]
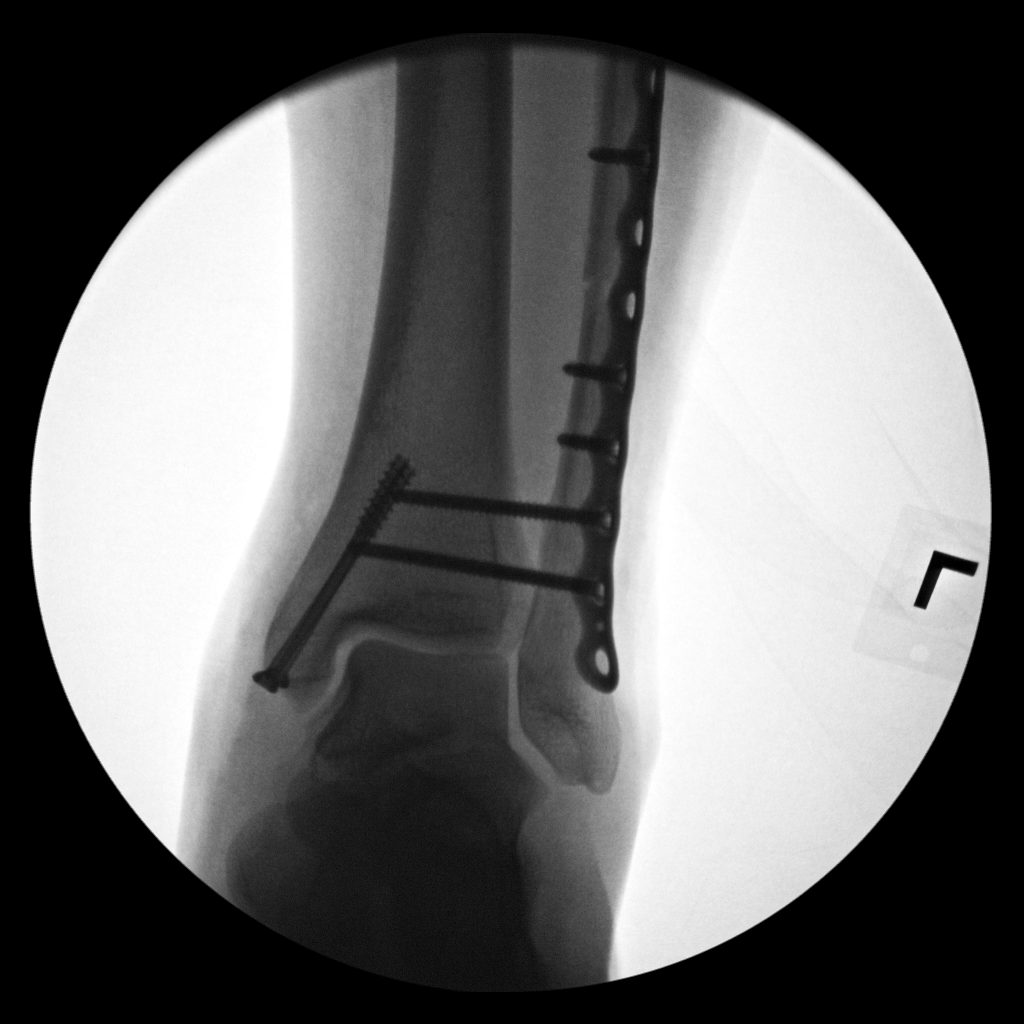
[im 2/4]
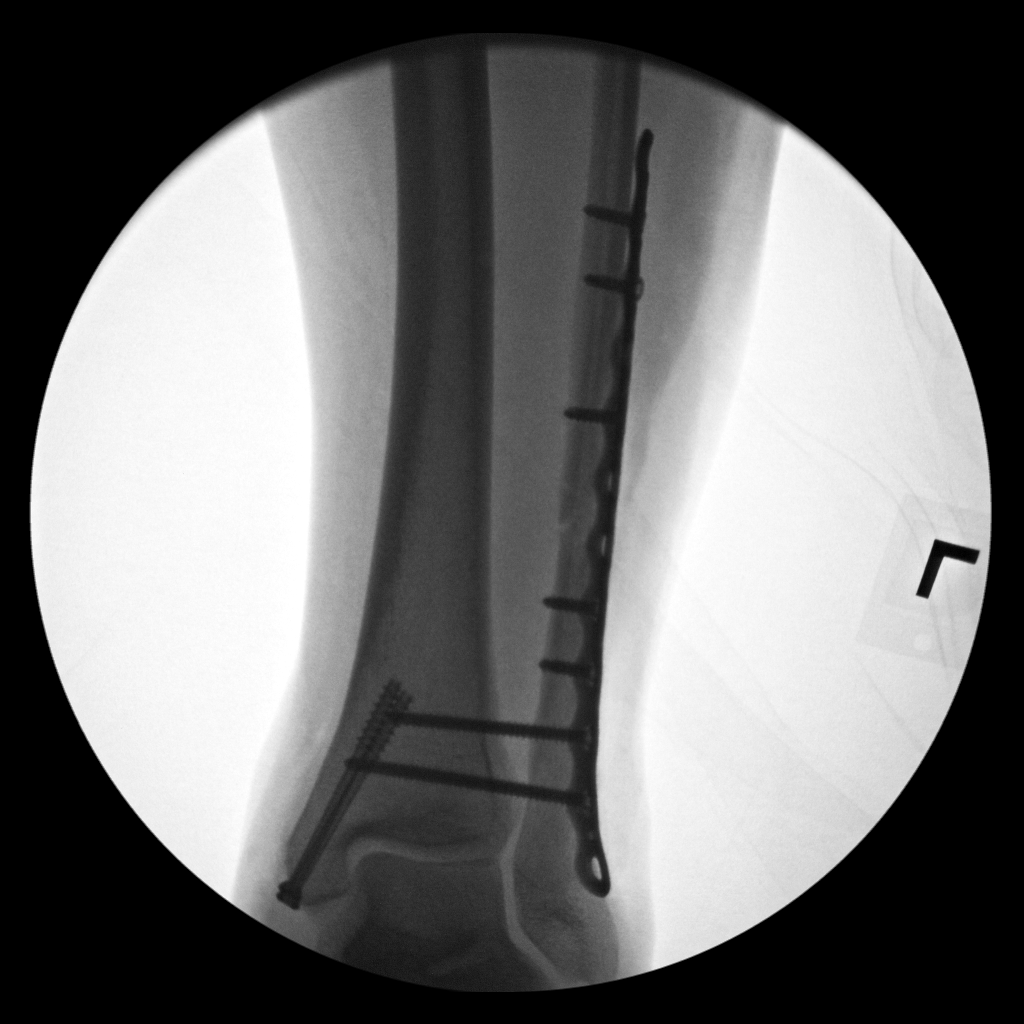
[im 3/4]
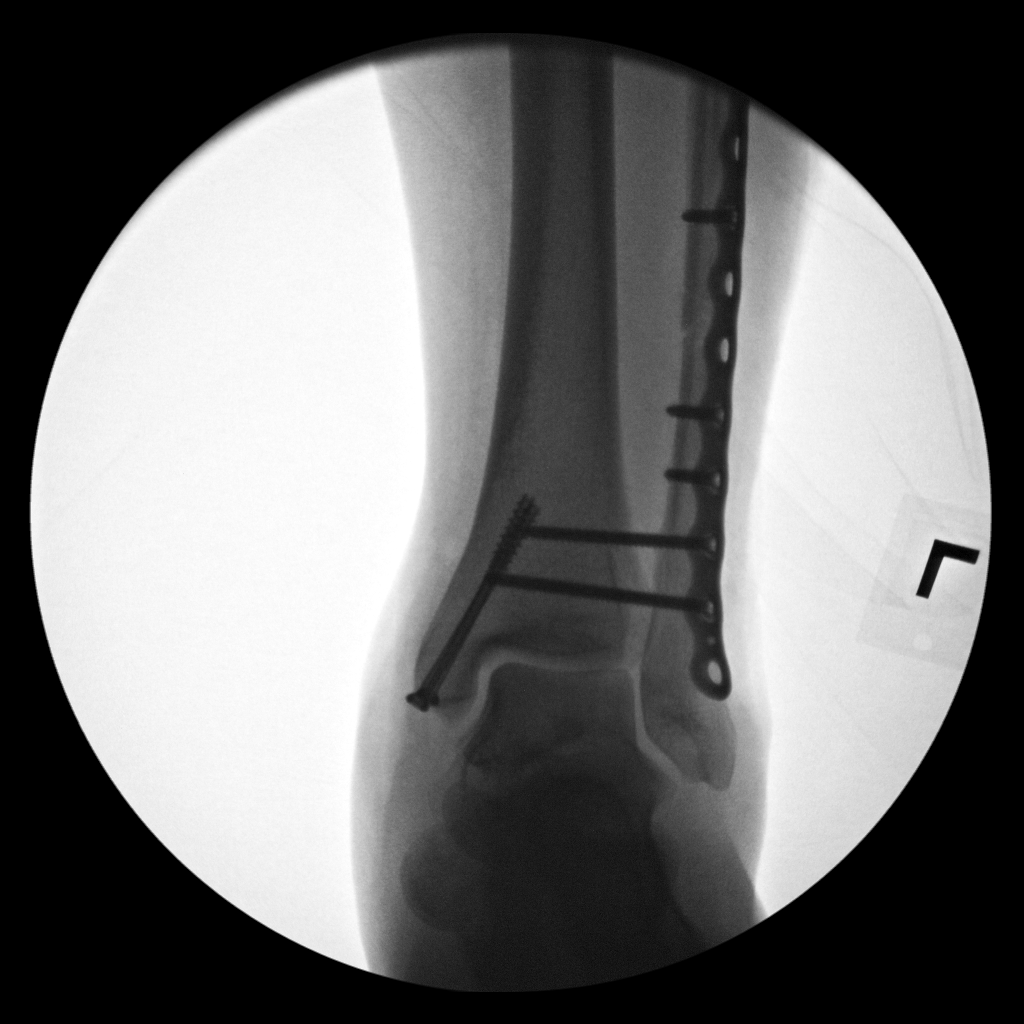
[im 4/4]
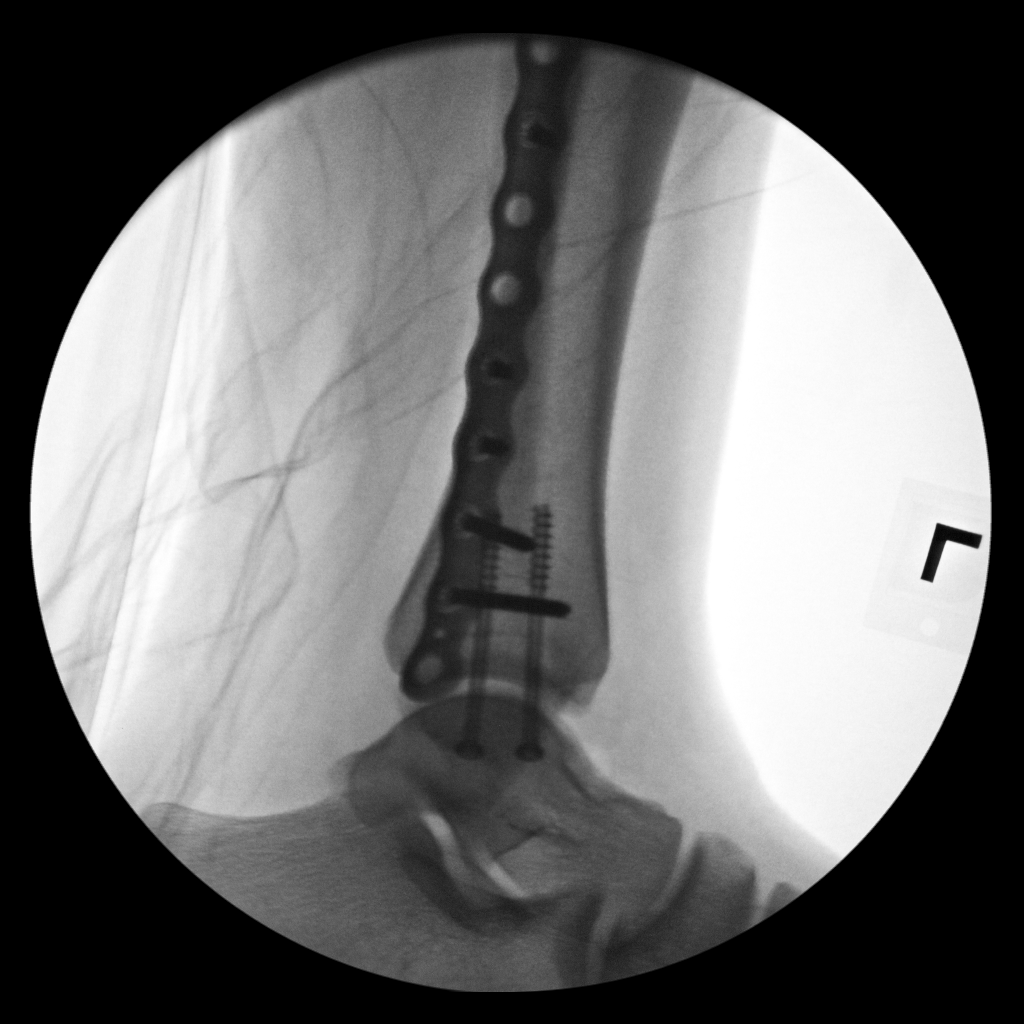

[4 of 4 positions shown; findings below may reference images not displayed]

FINDINGS: Four fluoroscopic images are obtained during the performance of the
procedure and are provided for interpretation only. Images
demonstrate plate and screw fixation across the distal fibular
fracture. Two screws traverse the tibiofibular syndesmosis. There
are 2 cannulated screws traversing the medial malleolar fracture.
Alignment is near anatomic.

FLUOROSCOPY TIME:  17 seconds
IMPRESSION: 1. ORIF left ankle fractures as above.

## 2020-11-19 IMAGING — RF DG ANKLE COMPLETE 3+V*L*
1 series · 4 of 4 positions shown · non-contrast
Comparison: 11/12/2019

CLINICAL DATA: Left ankle fracture

EXAM:
LEFT ANKLE COMPLETE - 3+ VIEW; DG C-ARM 1-60 MIN

[Series 1: run · 4 of 4 slices shown]
[im 1/4]
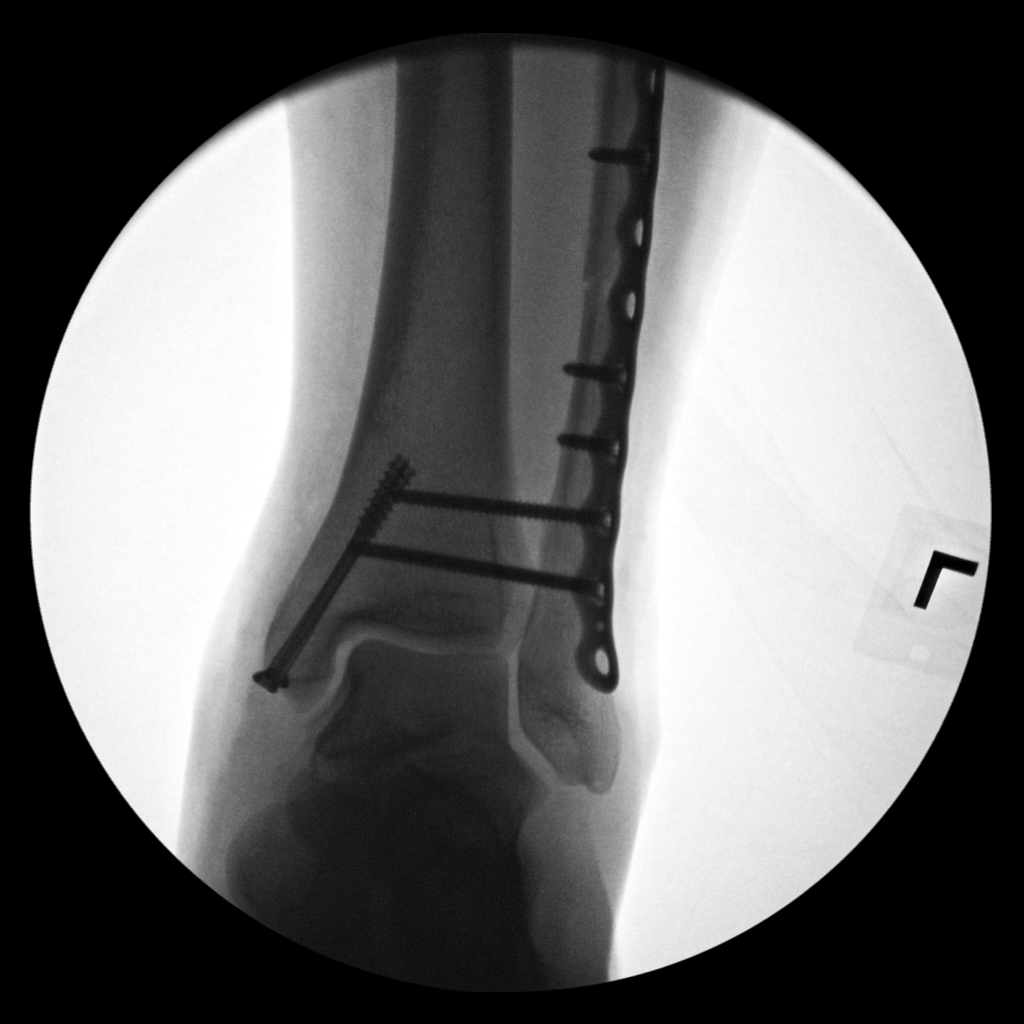
[im 2/4]
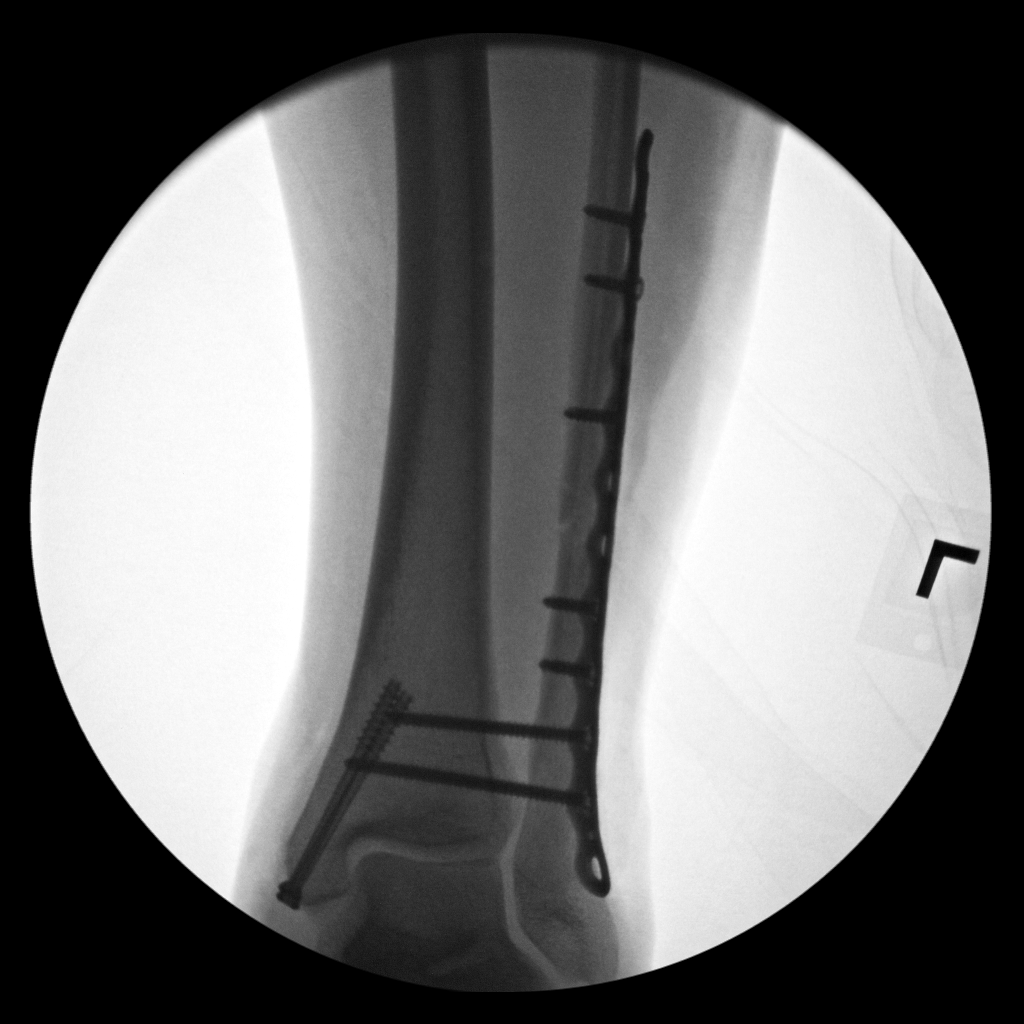
[im 3/4]
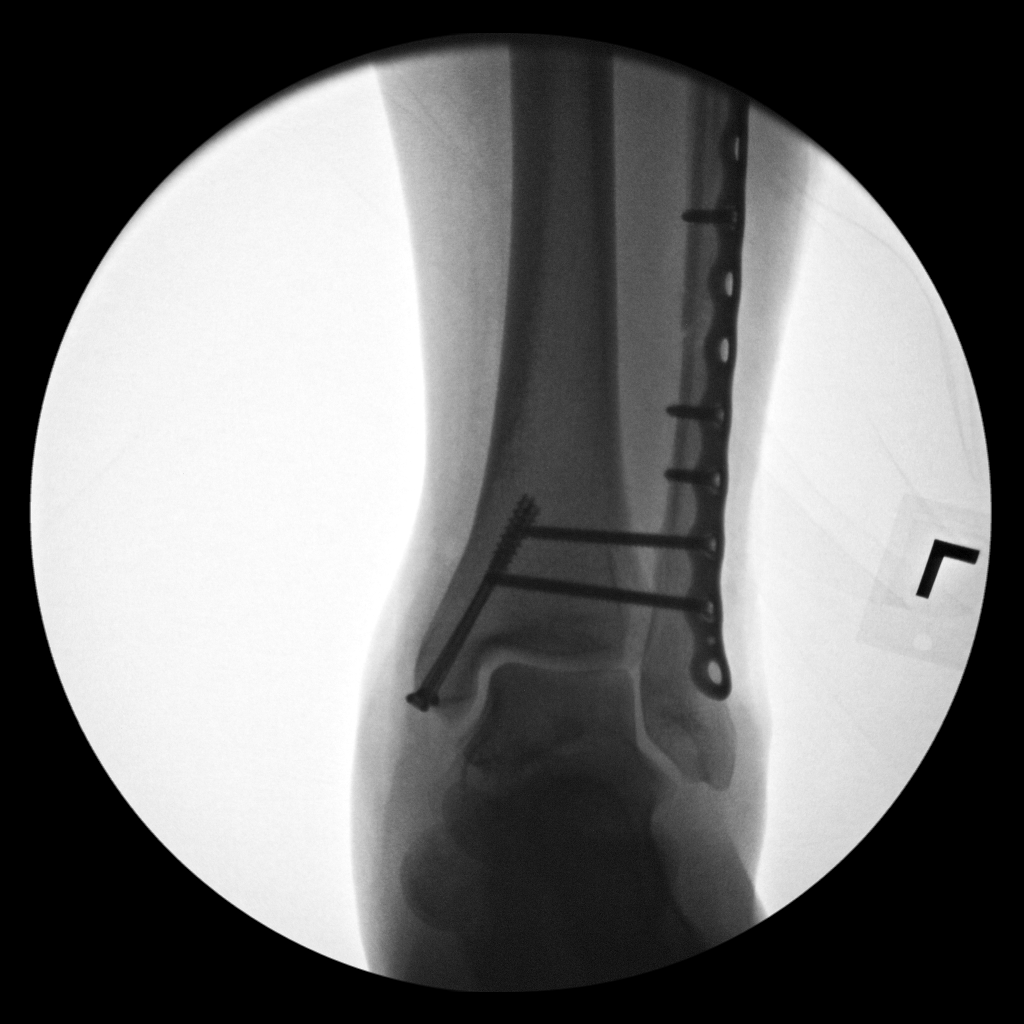
[im 4/4]
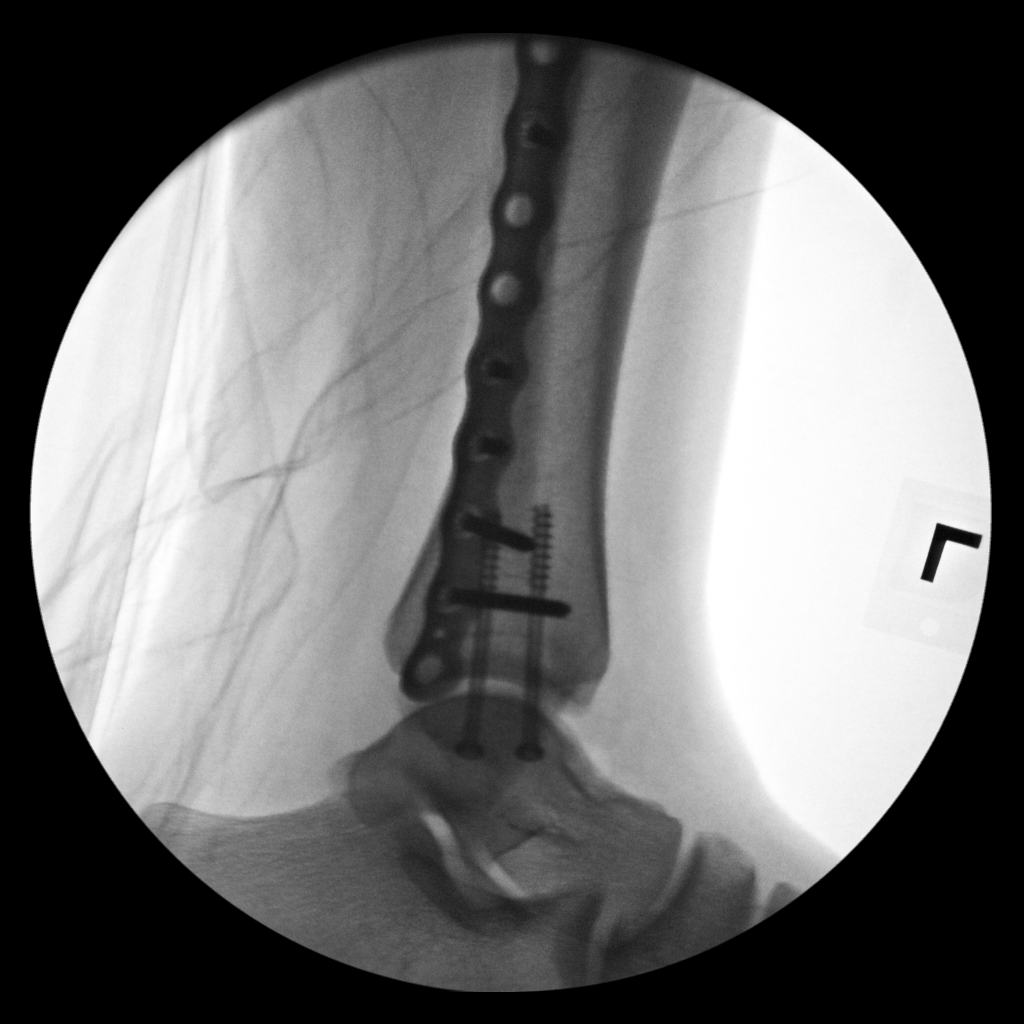

[4 of 4 positions shown; findings below may reference images not displayed]

FINDINGS: Four fluoroscopic images are obtained during the performance of the
procedure and are provided for interpretation only. Images
demonstrate plate and screw fixation across the distal fibular
fracture. Two screws traverse the tibiofibular syndesmosis. There
are 2 cannulated screws traversing the medial malleolar fracture.
Alignment is near anatomic.

FLUOROSCOPY TIME:  17 seconds
IMPRESSION: 1. ORIF left ankle fractures as above.

## 2020-12-30 ENCOUNTER — Telehealth: Payer: Self-pay

## 2020-12-30 NOTE — Telephone Encounter (Signed)
Patient aware handicap ready upfront for her

## 2020-12-30 NOTE — Telephone Encounter (Signed)
Patient called she wants to know if her handicap placard can be renewed she stated she hasn't been able to walk to class due to her backpack being so heavy and weighing ion her back also patient is requesting a copy of a referral for physical therapy she will come pick these up from the office today or tomorrow call back:[phone]

## 2020-12-30 NOTE — Telephone Encounter (Signed)
That will be fine.  I have no problem with her having one.

## 2021-07-06 ENCOUNTER — Telehealth: Payer: Self-pay | Admitting: Orthopaedic Surgery

## 2021-07-06 NOTE — Telephone Encounter (Signed)
I left form on Dr. Eliberto Ivory desk for signature. Can you please call patient once complete?

## 2021-07-06 NOTE — Telephone Encounter (Signed)
Pt called asking to have her handicap placard extended. She states she has one more semester in school and because it's such a big a campus she struggles to walk from her car to her classes. Pt would like a CB if this is possible and to have the placard left at the front desk.   [phone]

## 2021-07-06 NOTE — Telephone Encounter (Signed)
Ok for extension? For how long?

## 2021-07-07 NOTE — Telephone Encounter (Signed)
IC patient, advised ready to pick up

## 2021-07-07 NOTE — Telephone Encounter (Signed)
He signed it but I think this was put in your pickup stack. Do you have it?
